# Patient Record
Sex: Male | Born: 1945 | Race: White | Hispanic: No | Smoking: Former smoker
Health system: Southern US, Community
[De-identification: ages and names within clinical notes are randomized; demographics above are authoritative.]

## PROBLEM LIST (undated history)

## (undated) DIAGNOSIS — E119 Type 2 diabetes mellitus without complications: Secondary | ICD-10-CM

## (undated) DIAGNOSIS — I219 Acute myocardial infarction, unspecified: Secondary | ICD-10-CM

## (undated) DIAGNOSIS — N183 Chronic kidney disease, stage 3 unspecified: Secondary | ICD-10-CM

## (undated) DIAGNOSIS — G4730 Sleep apnea, unspecified: Secondary | ICD-10-CM

## (undated) DIAGNOSIS — Z8679 Personal history of other diseases of the circulatory system: Secondary | ICD-10-CM

## (undated) DIAGNOSIS — Z8489 Family history of other specified conditions: Secondary | ICD-10-CM

## (undated) HISTORY — PX: CARPAL TUNNEL RELEASE: SHX101

## (undated) HISTORY — PX: NOSE SURGERY: SHX723

## (undated) HISTORY — DX: Acute myocardial infarction, unspecified: I21.9

## (undated) HISTORY — PX: REPLACEMENT TOTAL KNEE: SUR1224

## (undated) HISTORY — DX: Chronic kidney disease, stage 3 unspecified: N18.30

## (undated) HISTORY — DX: Chronic kidney disease, stage 3 (moderate): N18.3

## (undated) HISTORY — PX: CARDIAC CATHETERIZATION: SHX172

## (undated) HISTORY — DX: Type 2 diabetes mellitus without complications: E11.9

## (undated) HISTORY — PX: ABLATION SAPHENOUS VEIN W/ RFA: SUR11

---

## 2016-07-25 LAB — HEMOGLOBIN A1C: HEMOGLOBIN A1C: 9.59

## 2016-07-25 LAB — TSH: TSH: 10.4 — AB (ref <=5.90)

## 2016-09-05 ENCOUNTER — Ambulatory Visit: Payer: Self-pay | Admitting: "Endocrinology

## 2017-08-08 LAB — HM DIABETES EYE EXAM

## 2017-08-15 ENCOUNTER — Encounter: Payer: Self-pay | Admitting: "Endocrinology

## 2017-08-15 ENCOUNTER — Ambulatory Visit (INDEPENDENT_AMBULATORY_CARE_PROVIDER_SITE_OTHER): Payer: Medicare Other | Admitting: "Endocrinology

## 2017-08-15 VITALS — BP 135/71 | HR 66 | Ht 68.5 in | Wt 283.0 lb

## 2017-08-15 DIAGNOSIS — E1122 Type 2 diabetes mellitus with diabetic chronic kidney disease: Secondary | ICD-10-CM | POA: Diagnosis not present

## 2017-08-15 DIAGNOSIS — I10 Essential (primary) hypertension: Secondary | ICD-10-CM | POA: Diagnosis not present

## 2017-08-15 DIAGNOSIS — E039 Hypothyroidism, unspecified: Secondary | ICD-10-CM | POA: Diagnosis not present

## 2017-08-15 DIAGNOSIS — E1159 Type 2 diabetes mellitus with other circulatory complications: Secondary | ICD-10-CM | POA: Diagnosis not present

## 2017-08-15 DIAGNOSIS — Z794 Long term (current) use of insulin: Secondary | ICD-10-CM

## 2017-08-15 DIAGNOSIS — E782 Mixed hyperlipidemia: Secondary | ICD-10-CM | POA: Insufficient documentation

## 2017-08-15 NOTE — Patient Instructions (Signed)

## 2017-08-15 NOTE — Progress Notes (Signed)
Subjective:    Patient ID: Bryce Mejia, male    DOB: 1946-04-13.  he is being seen in consultation for management of currently uncontrolled symptomatic diabetes requested by  Alanda Amass, MD.   Past Medical History:  Diagnosis Date  . Chronic kidney disease, stage 3 ([place])   . Diabetes mellitus, type II ([place])   . Heart attack ([place])    No past surgical history on file. Social History   Social History  . Marital status: Married    Spouse name: N/A  . Number of children: N/A  . Years of education: N/A   Social History Main Topics  . Smoking status: Former Smoker    Quit date: 08/16/2007  . Smokeless tobacco: Never Used  . Alcohol use Yes     Comment: Occassional  . Drug use: No  . Sexual activity: Not Asked   Other Topics Concern  . None   Social History Narrative  . None   Outpatient Encounter Prescriptions as of 08/15/2017  Medication Sig  . apixaban (ELIQUIS) 5 MG TABS tablet Take 5 mg by mouth 2 (two) times daily.  Marland Kitchen aspirin EC 81 MG tablet Take 81 mg by mouth daily.  . cetirizine (ZYRTEC) 10 MG tablet Take 10 mg by mouth daily.  . Cholecalciferol (VITAMIN D) 2000 units CAPS Take by mouth daily.  Marland Kitchen docusate sodium (DOCQLACE) 100 MG capsule Take 100 mg by mouth 2 (two) times daily.  . finasteride (PROSCAR) 5 MG tablet Take 5 mg by mouth daily.  . Flaxseed, Linseed, (FLAXSEED OIL PO) Take by mouth daily.  . fluticasone (FLOVENT DISKUS) 50 MCG/BLIST diskus inhaler Inhale 1 puff into the lungs 2 (two) times daily.  . furosemide (LASIX) 20 MG tablet Take 20 mg by mouth every other day.  . Garlic 1694 MG CAPS Take by mouth.  Marland Kitchen glipiZIDE (GLUCOTROL) 10 MG tablet Take 10 mg by mouth 2 (two) times daily before a meal.  . HYDROcodone-acetaminophen (NORCO/VICODIN) 5-325 MG tablet Take 1 tablet by mouth every 6 (six) hours as needed for moderate pain.  Marland Kitchen insulin aspart (NOVOLOG FLEXPEN) 100 UNIT/ML FlexPen Inject 6-12 Units into the skin 3 (three) times daily before  meals.  . insulin NPH Human (HUMULIN N,NOVOLIN N) 100 UNIT/ML injection Inject 20 Units into the skin 2 (two) times daily before a meal.  . levothyroxine (SYNTHROID, LEVOTHROID) 200 MCG tablet Take 200 mcg by mouth daily before breakfast.  . lisinopril (PRINIVIL,ZESTRIL) 10 MG tablet Take 10 mg by mouth daily.  . Misc Natural Products (BLACK CHERRY CONCENTRATE PO) Take by mouth every morning.  . Multiple Vitamins-Minerals (OCUVITE ADULT 50+ PO) Take by mouth daily.  . naproxen sodium (ANAPROX) 220 MG tablet Take 220 mg by mouth 2 (two) times daily with a meal.  . pravastatin (PRAVACHOL) 20 MG tablet Take 20 mg by mouth daily.  Marland Kitchen terazosin (HYTRIN) 10 MG capsule Take 10 mg by mouth at bedtime.  . traZODone (DESYREL) 100 MG tablet Take 100 mg by mouth at bedtime.   No facility-administered encounter medications on file as of 08/15/2017.     ALLERGIES: No Known Allergies  VACCINATION STATUS:  There is no immunization history on file for this patient.  Diabetes  He presents for his initial diabetic visit. He has type 2 diabetes mellitus. Onset time: He was diagnosed at approximate age of 63 years. His disease course has been improving. There are no hypoglycemic associated symptoms. Pertinent negatives for hypoglycemia include no confusion, headaches, pallor or  seizures. Associated symptoms include polydipsia and polyuria. Pertinent negatives for diabetes include no chest pain, no fatigue, no polyphagia and no weakness. There are no hypoglycemic complications. Symptoms are improving. Diabetic complications include heart disease and nephropathy. Risk factors for coronary artery disease include dyslipidemia, diabetes mellitus, hypertension, male sex, obesity, sedentary lifestyle and tobacco exposure. Current diabetic treatment includes insulin injections and oral agent (monotherapy) (He is currently on Novolin N 20 units twice a day, NovoLog 3-12 units 3 times a day before meals, glipizide 10 mg by  mouth twice a day.). His weight is increasing steadily. He is following a generally unhealthy diet. When asked about meal planning, he reported none. He has not had a previous visit with a dietitian. He never participates in exercise. His overall blood glucose range is 140-180 mg/dl. An ACE inhibitor/angiotensin II receptor blocker is being taken.  Hyperlipidemia  This is a chronic problem. The current episode started more than 1 year ago. Exacerbating diseases include diabetes, hypothyroidism and obesity. Pertinent negatives include no chest pain, myalgias or shortness of breath. Risk factors for coronary artery disease include diabetes mellitus, dyslipidemia, hypertension, male sex, obesity and a sedentary lifestyle.  Hypertension  This is a chronic problem. The current episode started more than 1 year ago. Pertinent negatives include no chest pain, headaches, neck pain, palpitations or shortness of breath. Risk factors for coronary artery disease include dyslipidemia, diabetes mellitus, male gender, obesity, sedentary lifestyle and smoking/tobacco exposure. Hypertensive end-organ damage includes kidney disease, CAD/MI and heart failure.       Review of Systems  Constitutional: Negative for chills, fatigue, fever and unexpected weight change.  HENT: Negative for dental problem, mouth sores and trouble swallowing.   Eyes: Negative for visual disturbance.  Respiratory: Negative for cough, choking, chest tightness, shortness of breath and wheezing.   Cardiovascular: Negative for chest pain, palpitations and leg swelling.  Gastrointestinal: Negative for abdominal distention, abdominal pain, constipation, diarrhea, nausea and vomiting.  Endocrine: Positive for polydipsia and polyuria. Negative for polyphagia.  Genitourinary: Negative for dysuria, flank pain, hematuria and urgency.  Musculoskeletal: Negative for back pain, gait problem, myalgias and neck pain.  Skin: Negative for pallor, rash and  wound.  Neurological: Negative for seizures, syncope, weakness, numbness and headaches.  Psychiatric/Behavioral: Negative.  Negative for confusion and dysphoric mood.    Objective:    BP 135/71   Pulse 66   Ht 5' 8.5" (1.74 m)   Wt 283 lb (128.4 kg)   BMI 42.40 kg/m   Wt Readings from Last 3 Encounters:  08/15/17 283 lb (128.4 kg)     Physical Exam  Constitutional: He is oriented to person, place, and time. He appears well-developed and well-nourished. He is cooperative. No distress.  HENT:  Head: Normocephalic and atraumatic.  Eyes: EOM are normal.  Neck: Normal range of motion. Neck supple. No tracheal deviation present. No thyromegaly present.  Cardiovascular: Normal rate, S1 normal, S2 normal and normal heart sounds.  Exam reveals no gallop.   No murmur heard. Pulses:      Dorsalis pedis pulses are 1+ on the right side, and 1+ on the left side.       Posterior tibial pulses are 1+ on the right side, and 1+ on the left side.  Pulmonary/Chest: Breath sounds normal. No respiratory distress. He has no wheezes.  Abdominal: Soft. Bowel sounds are normal. He exhibits no distension. There is no tenderness. There is no guarding and no CVA tenderness.  Musculoskeletal: He exhibits no edema.  Right shoulder: He exhibits no swelling and no deformity.  Neurological: He is alert and oriented to person, place, and time. He has normal strength and normal reflexes. No cranial nerve deficit or sensory deficit. Gait normal.  Skin: Skin is warm and dry. No rash noted. No cyanosis. Nails show no clubbing.  Psychiatric: He has a normal mood and affect. His speech is normal. Cognition and memory are normal.   Referral package includes labs from September 2017 when his A1c was 9.59%, TSH 10.4   Assessment & Plan:   1. DM type 2 causing vascular disease ([place]) - Patient has currently uncontrolled symptomatic type 2 DM since  71 years of age,  with most recent A1c of 9.5 % ( [facility] verbally  reports that most recent A1c was 7+%- not available for review today.  -He'll be sent for new set of labs including thyroid function tests and A1c today.   -his diabetes is complicated by coronary artery disease, CK D, obesity/sedentary life and Bryce Mejia remains at a high risk for more acute and chronic complications which include CAD, CVA, CKD, retinopathy, and neuropathy. These are all discussed in detail with the patient.  - I have counseled him on diet management and weight loss, by adopting a carbohydrate restricted/protein rich diet.  - Suggestion is made for him to avoid simple carbohydrates  from his diet including Cakes, Sweet Desserts, Ice Cream, Soda (diet and regular), Sweet Tea, Candies, Chips, Cookies, Store Bought Juices, Alcohol in Excess of  1-2 drinks a day, Artificial Sweeteners, and "Sugar-free" Products. This will help patient to have stable blood glucose profile and potentially avoid unintended weight gain.  - I encouraged him to switch to  unprocessed or minimally processed complex starch and increased protein intake (animal or plant source), fruits, and vegetables.  - he is advised to stick to a routine mealtimes to eat 3 meals  a day and avoid unnecessary snacks ( to snack only to correct hypoglycemia).   - he will be scheduled with Jearld Fenton, RDN, CDE for individualized DM education.  - I have approached him with the following individualized plan to manage diabetes and patient agrees:   - He is long acting insulin is not the best choice, however, he wishes to stay on it for cost reasons. - I  will proceed with basal insulin Novolin N 20 units with breakfast and 20 units at bedtime, readjust his NovoLog at 6 units 3 times a day with meals  for pre-meal BG readings of 90-150mg /dl, plus patient specific correction dose for unexpected hyperglycemia above 150mg /dl, associated with strict monitoring of glucose 4 times a day-before meals and at bedtime. - Patient is  warned not to take insulin without proper monitoring per orders. -Adjustment parameters are given for hypo and hyperglycemia in writing. -Patient is encouraged to call clinic for blood glucose levels less than 70 or above 300 mg /dl.  - I will discontinue glipizide, risk outweighs benefit for this patient. -Patient is not a candidate for  SGLT2 inhibitors due to CKD. - he will be considered for incretin therapy as appropriate next visit.  - Patient specific target  A1c;  LDL, HDL, Triglycerides, and  Waist Circumference were discussed in detail.  2) BP/HTN: Controlled. Continue current medications including ACEI/ARB. 3) Lipids/HPL:   Control unknown.   Patient is advised to continue statins. 4)  Weight/Diet: CDE Consult will be initiated , exercise, and detailed carbohydrates information provided.  5) Chronic Care/Health Maintenance:  -he  is on ACEI/ARB and Statin medications and  is encouraged to continue to follow up with Ophthalmology, Dentist,  Podiatrist at least yearly or according to recommendations, and advised to   stay away from smoking. I have recommended yearly flu vaccine and pneumonia vaccination at least every 5 years; moderate intensity exercise for up to 150 minutes weekly; and  sleep for at least 7 hours a day.  - Time spent with the patient: 1 hour, of which >50% was spent in obtaining information about his symptoms, reviewing his previous labs, evaluations, and treatments, counseling him about his  currently uncontrolled type 2 diabetes which is complicated by coronary artery disease and CK D, hypothyroidism, hyperlipidemia, and hypertension, and developing a plan for long term treatment.  - Patient to bring meter and  blood glucose logs during his next visit.  - I advised patient to maintain close follow up with Alanda Amass, MD for primary care needs.  Follow up plan: - Return in about 1 week (around 08/22/2017) for meter, and logs, labs today.  Glade Lloyd,  MD Phone: [phone]  Fax: [phone]   08/15/2017, 5:00 PM This note was partially dictated with voice recognition software. Similar sounding words can be transcribed inadequately or may not  be corrected upon review.

## 2017-08-16 LAB — COMPLETE METABOLIC PANEL WITH GFR
AG RATIO: 1.7 (calc) (ref 1.0–2.5)
ALT: 24 U/L (ref 9–46)
AST: 23 U/L (ref 10–35)
Albumin: 4.2 g/dL (ref 3.6–5.1)
Alkaline phosphatase (APISO): 61 U/L (ref 40–115)
BUN/Creatinine Ratio: 19 (calc) (ref 6–22)
BUN: 32 mg/dL — ABNORMAL HIGH (ref 7–25)
CALCIUM: 9.8 mg/dL (ref 8.6–10.3)
CO2: 26 mmol/L (ref 20–32)
Chloride: 108 mmol/L (ref 98–110)
Creat: 1.72 mg/dL — ABNORMAL HIGH (ref 0.70–1.18)
GFR, EST AFRICAN AMERICAN: 45 mL/min/{1.73_m2} — AB (ref >=60)
GFR, EST NON AFRICAN AMERICAN: 39 mL/min/{1.73_m2} — AB (ref >=60)
GLOBULIN: 2.5 g/dL (ref 1.9–3.7)
Glucose, Bld: 76 mg/dL (ref 65–139)
POTASSIUM: 5 mmol/L (ref 3.5–5.3)
SODIUM: 141 mmol/L (ref 135–146)
TOTAL PROTEIN: 6.7 g/dL (ref 6.1–8.1)
Total Bilirubin: 0.5 mg/dL (ref 0.2–1.2)

## 2017-08-16 LAB — HEMOGLOBIN A1C
Hgb A1c MFr Bld: 6.4 % of total Hgb — ABNORMAL HIGH (ref <5.7)
Mean Plasma Glucose: 137 (calc)
eAG (mmol/L): 7.6 (calc)

## 2017-08-16 LAB — TSH: TSH: 0.17 mIU/L — ABNORMAL LOW (ref 0.40–4.50)

## 2017-08-16 LAB — T4, FREE: FREE T4: 1.3 ng/dL (ref 0.8–1.8)

## 2017-08-21 ENCOUNTER — Ambulatory Visit (INDEPENDENT_AMBULATORY_CARE_PROVIDER_SITE_OTHER): Payer: Medicare Other | Admitting: "Endocrinology

## 2017-08-21 ENCOUNTER — Encounter: Payer: Self-pay | Admitting: "Endocrinology

## 2017-08-21 VITALS — BP 138/68 | HR 64 | Ht 68.5 in | Wt 271.0 lb

## 2017-08-21 DIAGNOSIS — E039 Hypothyroidism, unspecified: Secondary | ICD-10-CM

## 2017-08-21 DIAGNOSIS — E1159 Type 2 diabetes mellitus with other circulatory complications: Secondary | ICD-10-CM

## 2017-08-21 DIAGNOSIS — N183 Chronic kidney disease, stage 3 unspecified: Secondary | ICD-10-CM

## 2017-08-21 DIAGNOSIS — Z794 Long term (current) use of insulin: Secondary | ICD-10-CM

## 2017-08-21 DIAGNOSIS — E1122 Type 2 diabetes mellitus with diabetic chronic kidney disease: Secondary | ICD-10-CM | POA: Diagnosis not present

## 2017-08-21 DIAGNOSIS — I10 Essential (primary) hypertension: Secondary | ICD-10-CM | POA: Diagnosis not present

## 2017-08-21 MED ORDER — INSULIN NPH ISOPHANE & REGULAR (70-30) 100 UNIT/ML ~~LOC~~ SUSP: 15.0000 [IU] | Freq: Two times a day (BID) | SUBCUTANEOUS | 2 refills | Status: DC

## 2017-08-21 NOTE — Progress Notes (Signed)
Subjective:    Patient ID: Bryce Mejia, male    DOB: 1945-11-25.  he is being seen in consultation for management of currently uncontrolled symptomatic diabetes requested by  Alanda Amass, MD.   Past Medical History:  Diagnosis Date  . Chronic kidney disease, stage 3 ([place])   . Diabetes mellitus, type II ([place])   . Heart attack ([place])    No past surgical history on file. Social History   Social History  . Marital status: Married    Spouse name: N/A  . Number of children: N/A  . Years of education: N/A   Social History Main Topics  . Smoking status: Former Smoker    Quit date: 08/16/2007  . Smokeless tobacco: Never Used  . Alcohol use Yes     Comment: Occassional  . Drug use: No  . Sexual activity: Not on file   Other Topics Concern  . Not on file   Social History Narrative  . No narrative on file   Outpatient Encounter Prescriptions as of 08/21/2017  Medication Sig  . apixaban (ELIQUIS) 5 MG TABS tablet Take 5 mg by mouth 2 (two) times daily.  Marland Kitchen aspirin EC 81 MG tablet Take 81 mg by mouth daily.  . cetirizine (ZYRTEC) 10 MG tablet Take 10 mg by mouth daily.  . Cholecalciferol (VITAMIN D) 2000 units CAPS Take by mouth daily.  Marland Kitchen docusate sodium (DOCQLACE) 100 MG capsule Take 100 mg by mouth 2 (two) times daily.  . finasteride (PROSCAR) 5 MG tablet Take 5 mg by mouth daily.  . Flaxseed, Linseed, (FLAXSEED OIL PO) Take by mouth daily.  . fluticasone (FLOVENT DISKUS) 50 MCG/BLIST diskus inhaler Inhale 1 puff into the lungs 2 (two) times daily.  . furosemide (LASIX) 20 MG tablet Take 20 mg by mouth every other day.  . Garlic 2979 MG CAPS Take by mouth.  Marland Kitchen HYDROcodone-acetaminophen (NORCO/VICODIN) 5-325 MG tablet Take 1 tablet by mouth every 6 (six) hours as needed for moderate pain.  Marland Kitchen insulin NPH-regular Human (NOVOLIN 70/30) (70-30) 100 UNIT/ML injection Inject 15 Units into the skin 2 (two) times daily with a meal.  . levothyroxine (SYNTHROID, LEVOTHROID) 200  MCG tablet Take 200 mcg by mouth daily before breakfast.  . lisinopril (PRINIVIL,ZESTRIL) 10 MG tablet Take 10 mg by mouth daily.  . Misc Natural Products (BLACK CHERRY CONCENTRATE PO) Take by mouth every morning.  . Multiple Vitamins-Minerals (OCUVITE ADULT 50+ PO) Take by mouth daily.  . naproxen sodium (ANAPROX) 220 MG tablet Take 220 mg by mouth 2 (two) times daily with a meal.  . pravastatin (PRAVACHOL) 20 MG tablet Take 20 mg by mouth daily.  Marland Kitchen terazosin (HYTRIN) 10 MG capsule Take 10 mg by mouth at bedtime.  . traZODone (DESYREL) 100 MG tablet Take 100 mg by mouth at bedtime.  . [DISCONTINUED] glipiZIDE (GLUCOTROL) 10 MG tablet Take 10 mg by mouth 2 (two) times daily before a meal.  . [DISCONTINUED] insulin aspart (NOVOLOG FLEXPEN) 100 UNIT/ML FlexPen Inject 6-12 Units into the skin 3 (three) times daily before meals.  . [DISCONTINUED] insulin NPH Human (HUMULIN N,NOVOLIN N) 100 UNIT/ML injection Inject 20 Units into the skin 2 (two) times daily before a meal.   No facility-administered encounter medications on file as of 08/21/2017.     ALLERGIES: No Known Allergies  VACCINATION STATUS:  There is no immunization history on file for this patient.  Diabetes  He presents for his follow-up diabetic visit. He has type 2 diabetes mellitus.  Onset time: He was diagnosed at approximate age of 85 years. His disease course has been improving. There are no hypoglycemic associated symptoms. Pertinent negatives for hypoglycemia include no confusion, headaches, pallor or seizures. Associated symptoms include polydipsia and polyuria. Pertinent negatives for diabetes include no chest pain, no fatigue, no polyphagia and no weakness. There are no hypoglycemic complications. Symptoms are improving. Diabetic complications include heart disease and nephropathy. Risk factors for coronary artery disease include dyslipidemia, diabetes mellitus, hypertension, male sex, obesity, sedentary lifestyle and tobacco  exposure. Current diabetic treatment includes insulin injections and oral agent (monotherapy) (He is currently on Novolin N 20 units twice a day, NovoLog 3-12 units 3 times a day before meals, glipizide 10 mg by mouth twice a day.). His weight is decreasing steadily. He is following a generally unhealthy diet. When asked about meal planning, he reported none. He has not had a previous visit with a dietitian. He never participates in exercise. His breakfast blood glucose range is generally 130-140 mg/dl. His lunch blood glucose range is generally 130-140 mg/dl. His dinner blood glucose range is generally 130-140 mg/dl. His bedtime blood glucose range is generally 130-140 mg/dl. His overall blood glucose range is 130-140 mg/dl. An ACE inhibitor/angiotensin II receptor blocker is being taken.  Hyperlipidemia  This is a chronic problem. The current episode started more than 1 year ago. Exacerbating diseases include diabetes, hypothyroidism and obesity. Pertinent negatives include no chest pain, myalgias or shortness of breath. Risk factors for coronary artery disease include diabetes mellitus, dyslipidemia, hypertension, male sex, obesity and a sedentary lifestyle.  Hypertension  This is a chronic problem. The current episode started more than 1 year ago. Pertinent negatives include no chest pain, headaches, neck pain, palpitations or shortness of breath. Risk factors for coronary artery disease include dyslipidemia, diabetes mellitus, male gender, obesity, sedentary lifestyle and smoking/tobacco exposure. Hypertensive end-organ damage includes kidney disease, CAD/MI and heart failure.    Review of Systems  Constitutional: Negative for chills, fatigue, fever and unexpected weight change.  HENT: Negative for dental problem, mouth sores and trouble swallowing.   Eyes: Negative for visual disturbance.  Respiratory: Negative for cough, choking, chest tightness, shortness of breath and wheezing.   Cardiovascular:  Negative for chest pain, palpitations and leg swelling.  Gastrointestinal: Negative for abdominal distention, abdominal pain, constipation, diarrhea, nausea and vomiting.  Endocrine: Positive for polydipsia and polyuria. Negative for polyphagia.  Genitourinary: Negative for dysuria, flank pain, hematuria and urgency.  Musculoskeletal: Negative for back pain, gait problem, myalgias and neck pain.  Skin: Negative for pallor, rash and wound.  Neurological: Negative for seizures, syncope, weakness, numbness and headaches.  Psychiatric/Behavioral: Negative.  Negative for confusion and dysphoric mood.    Objective:    BP 138/68   Pulse 64   Ht 5' 8.5" (1.74 m)   Wt 271 lb (122.9 kg)   BMI 40.61 kg/m   Wt Readings from Last 3 Encounters:  08/21/17 271 lb (122.9 kg)  08/15/17 283 lb (128.4 kg)     Physical Exam  Constitutional: He is oriented to person, place, and time. He appears well-developed and well-nourished. He is cooperative. No distress.  HENT:  Head: Normocephalic and atraumatic.  Eyes: EOM are normal.  Neck: Normal range of motion. Neck supple. No tracheal deviation present. No thyromegaly present.  Cardiovascular: Normal rate, S1 normal, S2 normal and normal heart sounds.  Exam reveals no gallop.   No murmur heard. Pulses:      Dorsalis pedis pulses are 1+ on  the right side, and 1+ on the left side.       Posterior tibial pulses are 1+ on the right side, and 1+ on the left side.  Pulmonary/Chest: Breath sounds normal. No respiratory distress. He has no wheezes.  Abdominal: Soft. Bowel sounds are normal. He exhibits no distension. There is no tenderness. There is no guarding and no CVA tenderness.  Musculoskeletal: He exhibits no edema.       Right shoulder: He exhibits no swelling and no deformity.  Neurological: He is alert and oriented to person, place, and time. He has normal strength and normal reflexes. No cranial nerve deficit or sensory deficit. Gait normal.  Skin:  Skin is warm and dry. No rash noted. No cyanosis. Nails show no clubbing.  Psychiatric: He has a normal mood and affect. His speech is normal. Cognition and memory are normal.   Referral package includes labs from September 2017 when his A1c was 9.59%, TSH 10.4 Recent Results (from the past 2160 hour(s))  HM DIABETES EYE EXAM     Status: None   Collection Time: 08/08/17  2:47 PM  Result Value Ref Range   [facility] Diabetic Eye Exam No Retinopathy No Retinopathy  COMPLETE METABOLIC PANEL WITH GFR     Status: Abnormal   Collection Time: 08/15/17  3:53 PM  Result Value Ref Range   Glucose, Bld 76 65 - 139 mg/dL    Comment: .        Non-fasting reference interval .    BUN 32 (H) 7 - 25 mg/dL   Creat 1.72 (H) 0.70 - 1.18 mg/dL    Comment: For patients >4 years of age, the reference limit for Creatinine is approximately 13% higher for people identified as African-American. .    GFR, Est Non African American 39 (L) > OR = 60 mL/min/1.42m2   GFR, Est African American 45 (L) > OR = 60 mL/min/1.29m2   BUN/Creatinine Ratio 19 6 - 22 (calc)   Sodium 141 135 - 146 mmol/L   Potassium 5.0 3.5 - 5.3 mmol/L   Chloride 108 98 - 110 mmol/L   CO2 26 20 - 32 mmol/L   Calcium 9.8 8.6 - 10.3 mg/dL   Total Protein 6.7 6.1 - 8.1 g/dL   Albumin 4.2 3.6 - 5.1 g/dL   Globulin 2.5 1.9 - 3.7 g/dL (calc)   AG Ratio 1.7 1.0 - 2.5 (calc)   Total Bilirubin 0.5 0.2 - 1.2 mg/dL   Alkaline phosphatase (APISO) 61 40 - 115 U/L   AST 23 10 - 35 U/L   ALT 24 9 - 46 U/L  Hemoglobin A1c     Status: Abnormal   Collection Time: 08/15/17  3:53 PM  Result Value Ref Range   Hgb A1c MFr Bld 6.4 (H) <5.7 % of total Hgb    Comment: For someone without known diabetes, a hemoglobin  A1c value between 5.7% and 6.4% is consistent with prediabetes and should be confirmed with a  follow-up test. . For someone with known diabetes, a value <7% indicates that their diabetes is well controlled. A1c targets should be individualized  based on duration of diabetes, age, comorbid conditions, and other considerations. . This assay result is consistent with an increased risk of diabetes. . Currently, no consensus exists regarding use of hemoglobin A1c for diagnosis of diabetes for children. .    Mean Plasma Glucose 137 (calc)   eAG (mmol/L) 7.6 (calc)  TSH     Status: Abnormal   Collection  Time: 08/15/17  3:53 PM  Result Value Ref Range   TSH 0.17 (L) 0.40 - 4.50 mIU/L  T4, free     Status: None   Collection Time: 08/15/17  3:53 PM  Result Value Ref Range   Free T4 1.3 0.8 - 1.8 ng/dL     Assessment & Plan:   1. DM type 2 causing vascular disease ([place]) - Patient has currently uncontrolled symptomatic type 2 DM since  71 years of age. - Patient came with dramatic improvement in his glycemic profile averaging 127, his A1c improving to 6.4% from 9.5%.  -his diabetes is complicated by coronary artery disease, CKD, obesity/sedentary life and Janathan Bribiesca remains at a high risk for more acute and chronic complications which include CAD, CVA, CKD, retinopathy, and neuropathy. These are all discussed in detail with the patient.  - I have counseled him on diet management and weight loss, by adopting a carbohydrate restricted/protein rich diet.  - Suggestion is made for him to avoid simple carbohydrates  from his diet including Cakes, Sweet Desserts, Ice Cream, Soda (diet and regular), Sweet Tea, Candies, Chips, Cookies, Store Bought Juices, Alcohol in Excess of  1-2 drinks a day, Artificial Sweeteners, and "Sugar-free" Products. This will help patient to have stable blood glucose profile and potentially avoid unintended weight gain.  - I encouraged him to switch to  unprocessed or minimally processed complex starch and increased protein intake (animal or plant source), fruits, and vegetables.  - he is advised to stick to a routine mealtimes to eat 3 meals  a day and avoid unnecessary snacks ( to snack only to correct  hypoglycemia).   - he will be scheduled with Jearld Fenton, RDN, CDE for individualized DM education- consult pending  - I have approached him with the following individualized plan to manage diabetes and patient agrees:   - Based on his commitment and clinical improvement, his treatment can be simplified. - I will change his insulin to NovoLog in 70/30, 15 units with breakfast and 15 units with supper for pre-meal blood glucose readings above 90 mg/dL, associated with strict monitoring of blood glucose 2 times a day-before breakfast and before supper.  -I advised him to discontinue his NPH and NovoLog. - Patient is warned not to take insulin without proper monitoring per orders.  -Patient is encouraged to call clinic for blood glucose levels less than 70 or above 300 mg /dl.  -Patient is not a candidate for  SGLT2 inhibitors due to CKD. - he will be considered for metformin if renal function improves, and/or  incretin therapy as appropriate next visit.  - Patient specific target  A1c;  LDL, HDL, Triglycerides, and  Waist Circumference were discussed in detail.  2) BP/HTN: Controlled. Continue current medications including ACEI/ARB. 3) Lipids/HPL:   Control unknown. He would have fasting lipid panel on subsequent visits.  Patient is advised to continue statins. 4)  Weight/Diet: CDE Consult will be initiated , exercise, and detailed carbohydrates information provided.  5) hypothyroidism: - His thyroid function tests are consistent with appropriate replacement. -I advised him to continue levothyroxine on 200 g by mouth every morning for now.   - We discussed about correct intake of levothyroxine, at fasting, with water, separated by at least 30 minutes from breakfast, and separated by more than 4 hours from calcium, iron, multivitamins, acid reflux medications (PPIs). -Patient is made aware of the fact that thyroid hormone replacement is needed for life, dose to be adjusted by periodic  monitoring of thyroid function tests.  5) Chronic Care/Health Maintenance:  -he  is on ACEI/ARB and Statin medications and  is encouraged to continue to follow up with Ophthalmology, Dentist,  Podiatrist at least yearly or according to recommendations, and advised to   stay away from smoking. I have recommended yearly flu vaccine and pneumonia vaccination at least every 5 years; moderate intensity exercise for up to 150 minutes weekly; and  sleep for at least 7 hours a day.  - Time spent with the patient: 25 min, of which >50% was spent in reviewing his sugar logs , discussing his hypo- and hyper-glycemic episodes, reviewing his current and  previous labs and insulin doses and developing a plan to avoid hypo- and hyper-glycemia.    - Patient to bring meter and  blood glucose logs during his next visit.  - I advised patient to maintain close follow up with Alanda Amass, MD for primary care needs.  Follow up plan: - Return in about 3 months (around 11/21/2017) for follow up with pre-visit labs, meter, and logs.  Glade Lloyd, MD Phone: [phone]  Fax: [phone]   08/21/2017, 2:27 PM This note was partially dictated with voice recognition software. Similar sounding words can be transcribed inadequately or may not  be corrected upon review.

## 2017-08-21 NOTE — Patient Instructions (Signed)

## 2017-08-22 ENCOUNTER — Other Ambulatory Visit: Payer: Self-pay

## 2017-08-22 MED ORDER — INSULIN NPH ISOPHANE & REGULAR (70-30) 100 UNIT/ML ~~LOC~~ SUSP: 15.0000 [IU] | Freq: Two times a day (BID) | SUBCUTANEOUS | 2 refills | Status: DC

## 2018-01-01 ENCOUNTER — Ambulatory Visit: Payer: Medicare Other | Admitting: "Endocrinology

## 2018-01-02 LAB — COMPLETE METABOLIC PANEL WITH GFR
AG RATIO: 1.6 (calc) (ref 1.0–2.5)
ALBUMIN MSPROF: 3.8 g/dL (ref 3.6–5.1)
ALKALINE PHOSPHATASE (APISO): 57 U/L (ref 40–115)
ALT: 17 U/L (ref 9–46)
AST: 21 U/L (ref 10–35)
BILIRUBIN TOTAL: 0.4 mg/dL (ref 0.2–1.2)
BUN / CREAT RATIO: 15 (calc) (ref 6–22)
BUN: 29 mg/dL — ABNORMAL HIGH (ref 7–25)
CHLORIDE: 106 mmol/L (ref 98–110)
CO2: 27 mmol/L (ref 20–32)
Calcium: 9 mg/dL (ref 8.6–10.3)
Creat: 1.91 mg/dL — ABNORMAL HIGH (ref 0.70–1.18)
GFR, EST AFRICAN AMERICAN: 40 mL/min/{1.73_m2} — AB (ref >=60)
GFR, Est Non African American: 34 mL/min/{1.73_m2} — ABNORMAL LOW (ref >=60)
Globulin: 2.4 g/dL (calc) (ref 1.9–3.7)
Glucose, Bld: 158 mg/dL — ABNORMAL HIGH (ref 65–139)
POTASSIUM: 4.7 mmol/L (ref 3.5–5.3)
Sodium: 140 mmol/L (ref 135–146)
Total Protein: 6.2 g/dL (ref 6.1–8.1)

## 2018-01-02 LAB — HEMOGLOBIN A1C
EAG (MMOL/L): 8.4 (calc)
HEMOGLOBIN A1C: 6.9 %{Hb} — AB (ref <5.7)
MEAN PLASMA GLUCOSE: 151 (calc)

## 2018-01-17 ENCOUNTER — Encounter: Payer: Self-pay | Admitting: "Endocrinology

## 2018-01-17 ENCOUNTER — Ambulatory Visit (INDEPENDENT_AMBULATORY_CARE_PROVIDER_SITE_OTHER): Payer: Medicare Other | Admitting: "Endocrinology

## 2018-01-17 VITALS — BP 138/69 | HR 65 | Ht 68.5 in | Wt 267.0 lb

## 2018-01-17 DIAGNOSIS — N183 Chronic kidney disease, stage 3 unspecified: Secondary | ICD-10-CM

## 2018-01-17 DIAGNOSIS — I10 Essential (primary) hypertension: Secondary | ICD-10-CM | POA: Diagnosis not present

## 2018-01-17 DIAGNOSIS — E1159 Type 2 diabetes mellitus with other circulatory complications: Secondary | ICD-10-CM

## 2018-01-17 DIAGNOSIS — E782 Mixed hyperlipidemia: Secondary | ICD-10-CM

## 2018-01-17 DIAGNOSIS — E039 Hypothyroidism, unspecified: Secondary | ICD-10-CM

## 2018-01-17 DIAGNOSIS — E1122 Type 2 diabetes mellitus with diabetic chronic kidney disease: Secondary | ICD-10-CM | POA: Insufficient documentation

## 2018-01-17 DIAGNOSIS — Z794 Long term (current) use of insulin: Secondary | ICD-10-CM

## 2018-01-17 NOTE — Patient Instructions (Signed)

## 2018-01-17 NOTE — Progress Notes (Signed)
Subjective:    Patient ID: Bryce Mejia, male    DOB: [birth year].  he is being seen in follow-up for management of currently uncontrolled symptomatic diabetes requested by  Alanda Amass, MD.   Past Medical History:  Diagnosis Date  . Chronic kidney disease, stage 3 ([place])   . Diabetes mellitus, type II ([place])   . Heart attack [facility])    History reviewed. No pertinent surgical history. Social History   Socioeconomic History  . Marital status: Married    Spouse name: None  . Number of children: None  . Years of education: None  . Highest education level: None  Social Needs  . Financial resource strain: None  . Food insecurity - worry: None  . Food insecurity - inability: None  . Transportation needs - medical: None  . Transportation needs - non-medical: None  Occupational History  . None  Tobacco Use  . Smoking status: Former Smoker    Last attempt to quit: 08/16/2007    Years since quitting: 10.4  . Smokeless tobacco: Never Used  Substance and Sexual Activity  . Alcohol use: Yes    Comment: Occassional  . Drug use: No  . Sexual activity: None  Other Topics Concern  . None  Social History Narrative  . None   Outpatient Encounter Medications as of 01/17/2018  Medication Sig  . apixaban (ELIQUIS) 5 MG TABS tablet Take 5 mg by mouth 2 (two) times daily.  Marland Kitchen aspirin EC 81 MG tablet Take 81 mg by mouth daily.  . cetirizine (ZYRTEC) 10 MG tablet Take 10 mg by mouth daily.  . Cholecalciferol (VITAMIN D) 2000 units CAPS Take by mouth daily.  Marland Kitchen docusate sodium (DOCQLACE) 100 MG capsule Take 100 mg by mouth 2 (two) times daily.  . finasteride (PROSCAR) 5 MG tablet Take 5 mg by mouth daily.  . Flaxseed, Linseed, (FLAXSEED OIL PO) Take by mouth daily.  . fluticasone (FLOVENT DISKUS) 50 MCG/BLIST diskus inhaler Inhale 1 puff into the lungs 2 (two) times daily.  . furosemide (LASIX) 20 MG tablet Take 20 mg by mouth every other day.  . Garlic 7616 MG CAPS Take by mouth.  Marland Kitchen  HYDROcodone-acetaminophen (NORCO/VICODIN) 5-325 MG tablet Take 1 tablet by mouth every 6 (six) hours as needed for moderate pain.  Marland Kitchen insulin NPH-regular Human (NOVOLIN 70/30) (70-30) 100 UNIT/ML injection Inject 15 Units into the skin 2 (two) times daily with a meal.  . levothyroxine (SYNTHROID, LEVOTHROID) 200 MCG tablet Take 200 mcg by mouth daily before breakfast.  . lisinopril (PRINIVIL,ZESTRIL) 10 MG tablet Take 10 mg by mouth daily.  . Misc Natural Products (BLACK CHERRY CONCENTRATE PO) Take by mouth every morning.  . Multiple Vitamins-Minerals (OCUVITE ADULT 50+ PO) Take by mouth daily.  . naproxen sodium (ANAPROX) 220 MG tablet Take 220 mg by mouth 2 (two) times daily with a meal.  . pravastatin (PRAVACHOL) 20 MG tablet Take 20 mg by mouth daily.  Marland Kitchen terazosin (HYTRIN) 10 MG capsule Take 10 mg by mouth at bedtime.  . traZODone (DESYREL) 100 MG tablet Take 100 mg by mouth at bedtime.   No facility-administered encounter medications on file as of 01/17/2018.     ALLERGIES: No Known Allergies  VACCINATION STATUS:  There is no immunization history on file for this patient.  Diabetes  He presents for his follow-up diabetic visit. He has type 2 diabetes mellitus. Onset time: He was diagnosed at approximate age of 5 years. His disease course has been stable.  There are no hypoglycemic associated symptoms. Pertinent negatives for hypoglycemia include no confusion, headaches, pallor or seizures. Pertinent negatives for diabetes include no chest pain, no fatigue, no polydipsia, no polyphagia, no polyuria and no weakness. There are no hypoglycemic complications. Symptoms are stable. Diabetic complications include heart disease and nephropathy. Risk factors for coronary artery disease include dyslipidemia, diabetes mellitus, hypertension, male sex, obesity, sedentary lifestyle and tobacco exposure. Current diabetic treatment includes insulin injections and oral agent (monotherapy) (He is currently on  Novolin N 20 units twice a day, NovoLog 3-12 units 3 times a day before meals, glipizide 10 mg by mouth twice a day.). His weight is decreasing steadily. He is following a generally unhealthy diet. When asked about meal planning, he reported none. He has not had a previous visit with a dietitian. He never participates in exercise. His breakfast blood glucose range is generally 130-140 mg/dl. His bedtime blood glucose range is generally 130-140 mg/dl. His overall blood glucose range is 130-140 mg/dl. An ACE inhibitor/angiotensin II receptor blocker is being taken.  Hyperlipidemia  This is a chronic problem. The current episode started more than 1 year ago. The problem is uncontrolled. Exacerbating diseases include diabetes, hypothyroidism and obesity. Pertinent negatives include no chest pain, myalgias or shortness of breath. Current antihyperlipidemic treatment includes statins. Risk factors for coronary artery disease include diabetes mellitus, dyslipidemia, hypertension, male sex, obesity and a sedentary lifestyle.  Hypertension  This is a chronic problem. The current episode started more than 1 year ago. Pertinent negatives include no chest pain, headaches, neck pain, palpitations or shortness of breath. Risk factors for coronary artery disease include dyslipidemia, diabetes mellitus, male gender, obesity, sedentary lifestyle and smoking/tobacco exposure. Hypertensive end-organ damage includes kidney disease, CAD/MI and heart failure.    Review of Systems  Constitutional: Negative for chills, fatigue, fever and unexpected weight change.  HENT: Negative for dental problem, mouth sores and trouble swallowing.   Eyes: Negative for visual disturbance.  Respiratory: Negative for cough, choking, chest tightness, shortness of breath and wheezing.   Cardiovascular: Negative for chest pain, palpitations and leg swelling.  Gastrointestinal: Negative for abdominal distention, abdominal pain, constipation,  diarrhea, nausea and vomiting.  Endocrine: Negative for polydipsia, polyphagia and polyuria.  Genitourinary: Negative for dysuria, flank pain, hematuria and urgency.  Musculoskeletal: Negative for back pain, gait problem, myalgias and neck pain.  Skin: Negative for pallor, rash and wound.  Neurological: Negative for seizures, syncope, weakness, numbness and headaches.  Psychiatric/Behavioral: Negative.  Negative for confusion and dysphoric mood.    Objective:    BP 138/69   Pulse 65   Ht 5' 8.5" (1.74 m)   Wt 267 lb (121.1 kg)   BMI 40.01 kg/m   Wt Readings from Last 3 Encounters:  01/17/18 267 lb (121.1 kg)  08/21/17 271 lb (122.9 kg)  08/15/17 283 lb (128.4 kg)     Physical Exam  Constitutional: He is oriented to person, place, and time. He appears well-developed and well-nourished. He is cooperative. No distress.  HENT:  Head: Normocephalic and atraumatic.  Eyes: EOM are normal.  Neck: Normal range of motion. Neck supple. No tracheal deviation present. No thyromegaly present.  Cardiovascular: Normal rate, S1 normal, S2 normal and normal heart sounds. Exam reveals no gallop.  No murmur heard. Pulses:      Dorsalis pedis pulses are 1+ on the right side, and 1+ on the left side.       Posterior tibial pulses are 1+ on the right side, and 1+  on the left side.  Pulmonary/Chest: Breath sounds normal. No respiratory distress. He has no wheezes.  Abdominal: Soft. Bowel sounds are normal. He exhibits no distension. There is no tenderness. There is no guarding and no CVA tenderness.  Musculoskeletal: He exhibits no edema.       Right shoulder: He exhibits no swelling and no deformity.  Neurological: He is alert and oriented to person, place, and time. He has normal strength and normal reflexes. No cranial nerve deficit or sensory deficit. Gait normal.  Skin: Skin is warm and dry. No rash noted. No cyanosis. Nails show no clubbing.  Psychiatric: He has a normal mood and affect. His  speech is normal. Cognition and memory are normal.    Recent Results (from the past 2160 hour(s))  COMPLETE METABOLIC PANEL WITH GFR     Status: Abnormal   Collection Time: 01/01/18  1:41 PM  Result Value Ref Range   Glucose, Bld 158 (H) 65 - 139 mg/dL    Comment: .        Non-fasting reference interval .    BUN 29 (H) 7 - 25 mg/dL   Creat 1.91 (H) 0.70 - 1.18 mg/dL    Comment: For patients >2 years of age, the reference limit for Creatinine is approximately 13% higher for people identified as African-American. .    GFR, Est Non African American 34 (L) > OR = 60 mL/min/1.33m2   GFR, Est African American 40 (L) > OR = 60 mL/min/1.38m2   BUN/Creatinine Ratio 15 6 - 22 (calc)   Sodium 140 135 - 146 mmol/L   Potassium 4.7 3.5 - 5.3 mmol/L   Chloride 106 98 - 110 mmol/L   CO2 27 20 - 32 mmol/L   Calcium 9.0 8.6 - 10.3 mg/dL   Total Protein 6.2 6.1 - 8.1 g/dL   Albumin 3.8 3.6 - 5.1 g/dL   Globulin 2.4 1.9 - 3.7 g/dL (calc)   AG Ratio 1.6 1.0 - 2.5 (calc)   Total Bilirubin 0.4 0.2 - 1.2 mg/dL   Alkaline phosphatase (APISO) 57 40 - 115 U/L   AST 21 10 - 35 U/L   ALT 17 9 - 46 U/L  Hemoglobin A1c     Status: Abnormal   Collection Time: 01/01/18  1:41 PM  Result Value Ref Range   Hgb A1c MFr Bld 6.9 (H) <5.7 % of total Hgb    Comment: For someone without known diabetes, a hemoglobin A1c value of 6.5% or greater indicates that they may have  diabetes and this should be confirmed with a follow-up  test. . For someone with known diabetes, a value <7% indicates  that their diabetes is well controlled and a value  greater than or equal to 7% indicates suboptimal  control. A1c targets should be individualized based on  duration of diabetes, age, comorbid conditions, and  other considerations. . Currently, no consensus exists regarding use of hemoglobin A1c for diagnosis of diabetes for children. .    Mean Plasma Glucose 151 (calc)   eAG (mmol/L) 8.4 (calc)     Assessment  & Plan:   1. DM type 2 causing vascular disease ([place]) - Patient has currently uncontrolled symptomatic type 2 DM since  72 years of age. - Patient came with continued improvement in his blood glucose profile, A1c at 6.9% stable from his last visit.  He has improved his A1c from 9.5% overall.   -his diabetes is complicated by coronary artery disease, CKD, obesity/sedentary life and Jceon  Thrun remains at a high risk for more acute and chronic complications which include CAD, CVA, CKD, retinopathy, and neuropathy. These are all discussed in detail with the patient.  - I have counseled him on diet management and weight loss, by adopting a carbohydrate restricted/protein rich diet.  -  Suggestion is made for him to avoid simple carbohydrates  from his diet including Cakes, Sweet Desserts / Pastries, Ice Cream, Soda (diet and regular), Sweet Tea, Candies, Chips, Cookies, Store Bought Juices, Alcohol in Excess of  1-2 drinks a day, Artificial Sweeteners, and "Sugar-free" Products. This will help patient to have stable blood glucose profile and potentially avoid unintended weight gain.  weight gain.  - I encouraged him to switch to  unprocessed or minimally processed complex starch and increased protein intake (animal or plant source), fruits, and vegetables.  - he is advised to stick to a routine mealtimes to eat 3 meals  a day and avoid unnecessary snacks ( to snack only to correct hypoglycemia).   - he will be scheduled with Jearld Fenton, RDN, CDE for individualized DM education- consult pending  - I have approached him with the following individualized plan to manage diabetes and patient agrees:   - Based on his commitment and clinical improvement, his treatment can be simplified. - I will change his insulin to Novolin  70/30, 15 units with breakfast and 15 units with supper for pre-meal blood glucose readings above 90 mg/dL, associated with strict monitoring of blood glucose 2 times a  day-before breakfast and before supper.  - Patient is warned not to take insulin without proper monitoring per orders.  -Patient is encouraged to call clinic for blood glucose levels less than 70 or above 300 mg /dl.  -Patient is not a candidate for  SGLT2 inhibitors due to CKD. - he will be considered for metformin if renal function improves, and/or  incretin therapy as appropriate next visit.  - Patient specific target  A1c;  LDL, HDL, Triglycerides, and  Waist Circumference were discussed in detail.  2) BP/HTN: Controlled. Continue current medications including ACEI/ARB. 3) Lipids/HPL:   In 2017 his LDL was 90.  He would have fasting lipid panel on subsequent visits.  Patient is advised to continue statins. 4)  Weight/Diet: CDE Consult will be initiated , exercise, and detailed carbohydrates information provided.  5) hypothyroidism: - His thyroid function tests are consistent with appropriate replacement. -I advised him to continue levothyroxine on 200 g by mouth every morning for now.   - We discussed about correct intake of levothyroxine, at fasting, with water, separated by at least 30 minutes from breakfast, and separated by more than 4 hours from calcium, iron, multivitamins, acid reflux medications (PPIs). -Patient is made aware of the fact that thyroid hormone replacement is needed for life, dose to be adjusted by periodic monitoring of thyroid function tests.  5) Chronic Care/Health Maintenance:  -he  is on ACEI/ARB and Statin medications and  is encouraged to continue to follow up with Ophthalmology, Dentist,  Podiatrist at least yearly or according to recommendations, and advised to   stay away from smoking. I have recommended yearly flu vaccine and pneumonia vaccination at least every 5 years; moderate intensity exercise for up to 150 minutes weekly; and  sleep for at least 7 hours a day.  - Time spent with the patient: 25 min, of which >50% was spent in reviewing his blood  glucose logs , discussing his hypo- and hyper-glycemic episodes, reviewing his current and  previous labs and insulin doses and developing a plan to avoid hypo- and hyper-glycemia. Please refer to Patient Instructions for Blood Glucose Monitoring and Insulin/Medications Dosing Guide"  in media tab for additional information.  - I advised patient to maintain close follow up with Alanda Amass, MD for primary care needs.  Follow up plan: - Return in about 4 months (around 05/19/2018) for follow up with pre-visit labs, meter, and logs.  Glade Lloyd, MD Phone: [phone]  Fax: [phone]   01/17/2018, 5:05 PM This note was partially dictated with voice recognition software. Similar sounding words can be transcribed inadequately or may not  be corrected upon review.

## 2018-03-13 ENCOUNTER — Other Ambulatory Visit: Payer: Self-pay | Admitting: "Endocrinology

## 2018-03-21 LAB — HM DIABETES EYE EXAM

## 2018-05-15 LAB — LIPID PANEL
CHOL/HDL RATIO: 2.8 (calc) (ref <5.0)
CHOLESTEROL: 104 mg/dL (ref <200)
HDL: 37 mg/dL — AB (ref >40)
LDL CHOLESTEROL (CALC): 48 mg/dL
Non-HDL Cholesterol (Calc): 67 mg/dL (calc) (ref <130)
Triglycerides: 100 mg/dL (ref <150)

## 2018-05-15 LAB — HEMOGLOBIN A1C
Hgb A1c MFr Bld: 5.9 % of total Hgb — ABNORMAL HIGH (ref <5.7)
Mean Plasma Glucose: 123 (calc)
eAG (mmol/L): 6.8 (calc)

## 2018-05-15 LAB — COMPLETE METABOLIC PANEL WITH GFR
AG Ratio: 1.9 (calc) (ref 1.0–2.5)
ALBUMIN MSPROF: 3.8 g/dL (ref 3.6–5.1)
ALT: 11 U/L (ref 9–46)
AST: 16 U/L (ref 10–35)
Alkaline phosphatase (APISO): 52 U/L (ref 40–115)
BILIRUBIN TOTAL: 0.4 mg/dL (ref 0.2–1.2)
BUN / CREAT RATIO: 15 (calc) (ref 6–22)
BUN: 29 mg/dL — AB (ref 7–25)
CO2: 24 mmol/L (ref 20–32)
CREATININE: 1.93 mg/dL — AB (ref 0.70–1.18)
Calcium: 8.7 mg/dL (ref 8.6–10.3)
Chloride: 108 mmol/L (ref 98–110)
GFR, EST AFRICAN AMERICAN: 39 mL/min/{1.73_m2} — AB (ref >=60)
GFR, Est Non African American: 34 mL/min/{1.73_m2} — ABNORMAL LOW (ref >=60)
GLOBULIN: 2 g/dL (ref 1.9–3.7)
GLUCOSE: 170 mg/dL — AB (ref 65–99)
Potassium: 4.8 mmol/L (ref 3.5–5.3)
Sodium: 138 mmol/L (ref 135–146)
Total Protein: 5.8 g/dL — ABNORMAL LOW (ref 6.1–8.1)

## 2018-05-15 LAB — MICROALBUMIN / CREATININE URINE RATIO
Creatinine, Urine: 108 mg/dL (ref 20–320)
MICROALB UR: 130.5 mg/dL
Microalb Creat Ratio: 1208 mcg/mg creat — ABNORMAL HIGH (ref <30)

## 2018-05-15 LAB — T4, FREE: Free T4: 1.1 ng/dL (ref 0.8–1.8)

## 2018-05-15 LAB — TSH: TSH: 0.44 mIU/L (ref 0.40–4.50)

## 2018-05-23 ENCOUNTER — Encounter: Payer: Self-pay | Admitting: "Endocrinology

## 2018-05-23 ENCOUNTER — Ambulatory Visit (INDEPENDENT_AMBULATORY_CARE_PROVIDER_SITE_OTHER): Payer: Medicare Other | Admitting: "Endocrinology

## 2018-05-23 VITALS — BP 131/63 | HR 71 | Ht 68.5 in | Wt 261.0 lb

## 2018-05-23 DIAGNOSIS — E782 Mixed hyperlipidemia: Secondary | ICD-10-CM

## 2018-05-23 DIAGNOSIS — E1159 Type 2 diabetes mellitus with other circulatory complications: Secondary | ICD-10-CM | POA: Diagnosis not present

## 2018-05-23 DIAGNOSIS — E1122 Type 2 diabetes mellitus with diabetic chronic kidney disease: Secondary | ICD-10-CM | POA: Diagnosis not present

## 2018-05-23 DIAGNOSIS — Z794 Long term (current) use of insulin: Secondary | ICD-10-CM | POA: Diagnosis not present

## 2018-05-23 DIAGNOSIS — E039 Hypothyroidism, unspecified: Secondary | ICD-10-CM

## 2018-05-23 DIAGNOSIS — I10 Essential (primary) hypertension: Secondary | ICD-10-CM

## 2018-05-23 DIAGNOSIS — N183 Chronic kidney disease, stage 3 (moderate): Secondary | ICD-10-CM

## 2018-05-23 NOTE — Patient Instructions (Signed)

## 2018-05-23 NOTE — Progress Notes (Signed)
Subjective:    Patient ID: Matan Steen, male    DOB: [birth year].  he is being seen in follow-up for management of currently uncontrolled symptomatic diabetes requested by  Roderic Scarce, MD.   Past Medical History:  Diagnosis Date  . Chronic kidney disease, stage 3 ([place])   . Diabetes mellitus, type II ([place])   . Heart attack [facility])    History reviewed. No pertinent surgical history. Social History   Socioeconomic History  . Marital status: Married    Spouse name: Not on file  . Number of children: Not on file  . Years of education: Not on file  . Highest education level: Not on file  Occupational History  . Not on file  Social Needs  . Financial resource strain: Not on file  . Food insecurity:    Worry: Not on file    Inability: Not on file  . Transportation needs:    Medical: Not on file    Non-medical: Not on file  Tobacco Use  . Smoking status: Former Smoker    Last attempt to quit: 08/16/2007    Years since quitting: 10.7  . Smokeless tobacco: Never Used  Substance and Sexual Activity  . Alcohol use: Yes    Comment: Occassional  . Drug use: No  . Sexual activity: Not on file  Lifestyle  . Physical activity:    Days per week: Not on file    Minutes per session: Not on file  . Stress: Not on file  Relationships  . Social connections:    Talks on phone: Not on file    Gets together: Not on file    Attends religious service: Not on file    Active member of club or organization: Not on file    Attends meetings of clubs or organizations: Not on file    Relationship status: Not on file  Other Topics Concern  . Not on file  Social History Narrative  . Not on file   Outpatient Encounter Medications as of 05/23/2018  Medication Sig  . apixaban (ELIQUIS) 5 MG TABS tablet Take 5 mg by mouth 2 (two) times daily.  Marland Kitchen aspirin EC 81 MG tablet Take 81 mg by mouth daily.  . cetirizine (ZYRTEC) 10 MG tablet Take 10 mg by mouth daily.  . Cholecalciferol (VITAMIN  D) 2000 units CAPS Take by mouth daily.  Marland Kitchen docusate sodium (DOCQLACE) 100 MG capsule Take 100 mg by mouth 2 (two) times daily.  . finasteride (PROSCAR) 5 MG tablet Take 5 mg by mouth daily.  . Flaxseed, Linseed, (FLAXSEED OIL PO) Take by mouth daily.  . fluticasone (FLOVENT DISKUS) 50 MCG/BLIST diskus inhaler Inhale 1 puff into the lungs 2 (two) times daily.  . furosemide (LASIX) 20 MG tablet Take 20 mg by mouth every other day.  . Garlic 4680 MG CAPS Take by mouth.  Marland Kitchen HYDROcodone-acetaminophen (NORCO/VICODIN) 5-325 MG tablet Take 1 tablet by mouth every 6 (six) hours as needed for moderate pain.  Marland Kitchen levothyroxine (SYNTHROID, LEVOTHROID) 200 MCG tablet Take 200 mcg by mouth daily before breakfast.  . lisinopril (PRINIVIL,ZESTRIL) 10 MG tablet Take 10 mg by mouth daily.  . Misc Natural Products (BLACK CHERRY CONCENTRATE PO) Take by mouth every morning.  . Multiple Vitamins-Minerals (OCUVITE ADULT 50+ PO) Take by mouth daily.  . naproxen sodium (ANAPROX) 220 MG tablet Take 220 mg by mouth 2 (two) times daily with a meal.  . NOVOLIN 70/30 RELION (70-30) 100 UNIT/ML injection INJECT  15 UNITS INTO THE SKIN TWICE A DAY WITH A MEAL  . pravastatin (PRAVACHOL) 20 MG tablet Take 20 mg by mouth daily.  Marland Kitchen terazosin (HYTRIN) 10 MG capsule Take 10 mg by mouth at bedtime.  . traZODone (DESYREL) 100 MG tablet Take 100 mg by mouth at bedtime.   No facility-administered encounter medications on file as of 05/23/2018.     ALLERGIES: No Known Allergies  VACCINATION STATUS:  There is no immunization history on file for this patient.  Diabetes  He presents for his follow-up diabetic visit. He has type 2 diabetes mellitus. Onset time: He was diagnosed at approximate age of 3 years. His disease course has been improving. There are no hypoglycemic associated symptoms. Pertinent negatives for hypoglycemia include no confusion, headaches, pallor or seizures. Pertinent negatives for diabetes include no chest pain,  no fatigue, no polydipsia, no polyphagia, no polyuria and no weakness. There are no hypoglycemic complications. Symptoms are improving. Diabetic complications include heart disease and nephropathy. Risk factors for coronary artery disease include dyslipidemia, diabetes mellitus, hypertension, male sex, obesity, sedentary lifestyle and tobacco exposure. Current diabetic treatment includes insulin injections and oral agent (monotherapy) (He is currently on Novolin N 20 units twice a day, NovoLog 3-12 units 3 times a day before meals, glipizide 10 mg by mouth twice a day.). His weight is decreasing steadily. He is following a generally unhealthy diet. When asked about meal planning, he reported none. He has not had a previous visit with a dietitian. He never participates in exercise. His breakfast blood glucose range is generally 130-140 mg/dl. His bedtime blood glucose range is generally 130-140 mg/dl. His overall blood glucose range is 130-140 mg/dl. An ACE inhibitor/angiotensin II receptor blocker is being taken.  Hyperlipidemia  This is a chronic problem. The current episode started more than 1 year ago. The problem is uncontrolled. Exacerbating diseases include diabetes, hypothyroidism and obesity. Pertinent negatives include no chest pain, myalgias or shortness of breath. Current antihyperlipidemic treatment includes statins. Risk factors for coronary artery disease include diabetes mellitus, dyslipidemia, hypertension, male sex, obesity and a sedentary lifestyle.  Hypertension  This is a chronic problem. The current episode started more than 1 year ago. Pertinent negatives include no chest pain, headaches, neck pain, palpitations or shortness of breath. Risk factors for coronary artery disease include dyslipidemia, diabetes mellitus, male gender, obesity, sedentary lifestyle and smoking/tobacco exposure. Hypertensive end-organ damage includes kidney disease, CAD/MI and heart failure.    Review of Systems   Constitutional: Negative for chills, fatigue, fever and unexpected weight change.  HENT: Negative for dental problem, mouth sores and trouble swallowing.   Eyes: Negative for visual disturbance.  Respiratory: Negative for cough, choking, chest tightness, shortness of breath and wheezing.   Cardiovascular: Negative for chest pain, palpitations and leg swelling.  Gastrointestinal: Negative for abdominal distention, abdominal pain, constipation, diarrhea, nausea and vomiting.  Endocrine: Negative for polydipsia, polyphagia and polyuria.  Genitourinary: Negative for dysuria, flank pain, hematuria and urgency.  Musculoskeletal: Negative for back pain, gait problem, myalgias and neck pain.  Skin: Negative for pallor, rash and wound.  Neurological: Negative for seizures, syncope, weakness, numbness and headaches.  Psychiatric/Behavioral: Negative.  Negative for confusion and dysphoric mood.    Objective:    BP 131/63   Pulse 71   Ht 5' 8.5" (1.74 m)   Wt 261 lb (118.4 kg)   BMI 39.11 kg/m   Wt Readings from Last 3 Encounters:  05/23/18 261 lb (118.4 kg)  01/17/18 267 lb (121.1  kg)  08/21/17 271 lb (122.9 kg)     Physical Exam  Constitutional: He is oriented to person, place, and time. He appears well-developed. He is cooperative. No distress.  HENT:  Head: Normocephalic and atraumatic.  Eyes: EOM are normal.  Neck: Normal range of motion. Neck supple. No tracheal deviation present. No thyromegaly present.  Cardiovascular: Normal rate, S1 normal and S2 normal. Exam reveals no gallop.  No murmur heard. Pulses:      Dorsalis pedis pulses are 1+ on the right side, and 1+ on the left side.       Posterior tibial pulses are 1+ on the right side, and 1+ on the left side.  Pulmonary/Chest: Effort normal. No respiratory distress. He has no wheezes.  Abdominal: He exhibits no distension. There is no tenderness. There is no guarding and no CVA tenderness.  Musculoskeletal: He exhibits no  edema.       Right shoulder: He exhibits no swelling and no deformity.  Neurological: He is alert and oriented to person, place, and time. He has normal strength and normal reflexes. No cranial nerve deficit or sensory deficit. Gait normal.  Skin: Skin is warm and dry. No rash noted. No cyanosis. Nails show no clubbing.  Psychiatric: He has a normal mood and affect. His speech is normal. Cognition and memory are normal.    Recent Results (from the past 2160 hour(s))  HM DIABETES EYE EXAM     Status: None   Collection Time: 03/21/18 10:45 AM  Result Value Ref Range   HM Diabetic Eye Exam No Retinopathy No Retinopathy  COMPLETE METABOLIC PANEL WITH GFR     Status: Abnormal   Collection Time: 05/13/18  3:54 PM  Result Value Ref Range   Glucose, Bld 170 (H) 65 - 99 mg/dL    Comment: .            Fasting reference interval . For someone without known diabetes, a glucose value >125 mg/dL indicates that they may have diabetes and this should be confirmed with a follow-up test. .    BUN 29 (H) 7 - 25 mg/dL   Creat 1.93 (H) 0.70 - 1.18 mg/dL    Comment: For patients >24 years of age, the reference limit for Creatinine is approximately 13% higher for people identified as African-American. .    GFR, Est Non African American 34 (L) > OR = 60 mL/min/1.67m2   GFR, Est African American 39 (L) > OR = 60 mL/min/1.14m2   BUN/Creatinine Ratio 15 6 - 22 (calc)   Sodium 138 135 - 146 mmol/L   Potassium 4.8 3.5 - 5.3 mmol/L   Chloride 108 98 - 110 mmol/L   CO2 24 20 - 32 mmol/L   Calcium 8.7 8.6 - 10.3 mg/dL   Total Protein 5.8 (L) 6.1 - 8.1 g/dL   Albumin 3.8 3.6 - 5.1 g/dL   Globulin 2.0 1.9 - 3.7 g/dL (calc)   AG Ratio 1.9 1.0 - 2.5 (calc)   Total Bilirubin 0.4 0.2 - 1.2 mg/dL   Alkaline phosphatase (APISO) 52 40 - 115 U/L   AST 16 10 - 35 U/L   ALT 11 9 - 46 U/L  Hemoglobin A1c     Status: Abnormal   Collection Time: 05/13/18  3:54 PM  Result Value Ref Range   Hgb A1c MFr Bld 5.9 (H)  <5.7 % of total Hgb    Comment: For someone without known diabetes, a hemoglobin  A1c value between 5.7% and 6.4%  is consistent with prediabetes and should be confirmed with a  follow-up test. . For someone with known diabetes, a value <7% indicates that their diabetes is well controlled. A1c targets should be individualized based on duration of diabetes, age, comorbid conditions, and other considerations. . This assay result is consistent with an increased risk of diabetes. . Currently, no consensus exists regarding use of hemoglobin A1c for diagnosis of diabetes for children. .    Mean Plasma Glucose 123 (calc)   eAG (mmol/L) 6.8 (calc)  Lipid panel     Status: Abnormal   Collection Time: 05/13/18  3:54 PM  Result Value Ref Range   Cholesterol 104 <200 mg/dL   HDL 37 (L) >40 mg/dL   Triglycerides 100 <150 mg/dL   LDL Cholesterol (Calc) 48 mg/dL (calc)    Comment: Reference range: <100 . Desirable range <100 mg/dL for primary prevention;   <70 mg/dL for patients with CHD or diabetic patients  with > or = 2 CHD risk factors. Marland Kitchen LDL-C is now calculated using the Martin-Hopkins  calculation, which is a validated novel method providing  better accuracy than the Friedewald equation in the  estimation of LDL-C.  Cresenciano Genre et al. Annamaria Helling. 8119;147(82): 2061-2068  (http://education.[facility].com/faq/FAQ164)    Total CHOL/HDL Ratio 2.8 <5.0 (calc)   Non-HDL Cholesterol (Calc) 67 <130 mg/dL (calc)    Comment: For patients with diabetes plus 1 major ASCVD risk  factor, treating to a non-HDL-C goal of <100 mg/dL  (LDL-C of <70 mg/dL) is considered a therapeutic  option.   Microalbumin / creatinine urine ratio     Status: Abnormal   Collection Time: 05/13/18  3:54 PM  Result Value Ref Range   Creatinine, Urine 108 20 - 320 mg/dL   Microalb, Ur 130.5 mg/dL    Comment: Verified by repeat analysis. Marland Kitchen Reference Range Not established    Microalb Creat Ratio 1,208 (H) <30  mcg/mg creat    Comment: . The ADA defines abnormalities in albumin excretion as follows: Marland Kitchen Category         Result (mcg/mg creatinine) . Normal                    <30 Microalbuminuria         30-299  Clinical albuminuria   > OR = 300 . The ADA recommends that at least two of three specimens collected within a 3-6 month period be abnormal before considering a patient to be within a diagnostic category.   TSH     Status: None   Collection Time: 05/13/18  3:54 PM  Result Value Ref Range   TSH 0.44 0.40 - 4.50 mIU/L  T4, free     Status: None   Collection Time: 05/13/18  3:54 PM  Result Value Ref Range   Free T4 1.1 0.8 - 1.8 ng/dL     Assessment & Plan:   1. DM type 2 causing vascular disease ([place]) - Patient has currently uncontrolled symptomatic type 2 DM since  72 years of age. - Patient came with continued improvement in his blood glucose profile, A1c at 5.9%,  Overall improved his A1c from 9.5% overall.   -his diabetes is complicated by coronary artery disease, CKD, obesity/sedentary life and Jarmarcus Wambold remains at a high risk for more acute and chronic complications which include CAD, CVA, CKD, retinopathy, and neuropathy. These are all discussed in detail with the patient.  - I have counseled him on diet management and weight loss, by adopting  a carbohydrate restricted/protein rich diet.  -  Suggestion is made for him to avoid simple carbohydrates  from his diet including Cakes, Sweet Desserts / Pastries, Ice Cream, Soda (diet and regular), Sweet Tea, Candies, Chips, Cookies, Store Bought Juices, Alcohol in Excess of  1-2 drinks a day, Artificial Sweeteners, and "Sugar-free" Products. This will help patient to have stable blood glucose profile and potentially avoid unintended weight gain.  - I encouraged him to switch to  unprocessed or minimally processed complex starch and increased protein intake (animal or plant source), fruits, and vegetables.  - he is advised to  stick to a routine mealtimes to eat 3 meals  a day and avoid unnecessary snacks ( to snack only to correct hypoglycemia).    - I have approached him with the following individualized plan to manage diabetes and patient agrees:  -  - I will change his insulin to Novolin  70/30, 15 units with breakfast and 15 units with supper for pre-meal blood glucose readings above 90 mg/dL, associated with strict monitoring of blood glucose 2 times a day-before breakfast and before supper.  - Patient is warned not to take insulin without proper monitoring per orders.  -Patient is encouraged to call clinic for blood glucose levels less than 70 or above 300 mg /dl.  -Patient is not a candidate for  SGLT2 inhibitors due to CKD. - he will be considered for metformin if renal function improves, and/or  incretin therapy as appropriate next visit.  - Patient specific target  A1c;  LDL, HDL, Triglycerides, and  Waist Circumference were discussed in detail.  2) BP/HTN: His blood pressure is controlled to target.  He is advised to continue his current blood pressure medications including lisinopril 10 mg p.o. daily.   3) Lipids/HPL: His most recent lipid panel showed controlled LDL at 48 improving from 90.  He will continue to benefit from pravastatin 20 mg p.o. nightly.   4)  Weight/Diet: CDE Consult will be initiated , exercise, and detailed carbohydrates information provided.  5) hypothyroidism: - His thyroid function tests are consistent with appropriate replacement. -I advised him to continue levothyroxine on 200 g p.o. every morning before breakfast.     - We discussed about correct intake of levothyroxine, at fasting, with water, separated by at least 30 minutes from breakfast, and separated by more than 4 hours from calcium, iron, multivitamins, acid reflux medications (PPIs). -Patient is made aware of the fact that thyroid hormone replacement is needed for life, dose to be adjusted by periodic  monitoring of thyroid function tests.  5) Chronic Care/Health Maintenance:  -he  is on ACEI/ARB and Statin medications and  is encouraged to continue to follow up with Ophthalmology, Dentist,  Podiatrist at least yearly or according to recommendations, and advised to   stay away from smoking. I have recommended yearly flu vaccine and pneumonia vaccination at least every 5 years; moderate intensity exercise for up to 150 minutes weekly; and  sleep for at least 7 hours a day.  - I advised patient to maintain close follow up with Roderic Scarce, MD for primary care needs.  - Time spent with the patient: 25 min, of which >50% was spent in reviewing his blood glucose logs , discussing his hypo- and hyper-glycemic episodes, reviewing his current and  previous labs and insulin doses and developing a plan to avoid hypo- and hyper-glycemia. Please refer to Patient Instructions for Blood Glucose Monitoring and Insulin/Medications Dosing Guide"  in media tab  for additional information. Zackery Brine participated in the discussions, expressed understanding, and voiced agreement with the above plans.  All questions were answered to his satisfaction. he is encouraged to contact clinic should he have any questions or concerns prior to his return visit.   Follow up plan: - Return in about 4 months (around 09/23/2018) for follow up with pre-visit labs, meter, and logs.  Glade Lloyd, MD Phone: [phone]  Fax: [phone]   05/23/2018, 3:55 PM This note was partially dictated with voice recognition software. Similar sounding words can be transcribed inadequately or may not  be corrected upon review.

## 2018-06-20 ENCOUNTER — Other Ambulatory Visit: Payer: Self-pay | Admitting: "Endocrinology

## 2018-09-03 ENCOUNTER — Other Ambulatory Visit: Payer: Self-pay | Admitting: "Endocrinology

## 2018-09-25 LAB — T4, FREE: FREE T4: 1.3 ng/dL (ref 0.8–1.8)

## 2018-09-25 LAB — COMPLETE METABOLIC PANEL WITH GFR
AG Ratio: 1.5 (calc) (ref 1.0–2.5)
ALBUMIN MSPROF: 3.8 g/dL (ref 3.6–5.1)
ALKALINE PHOSPHATASE (APISO): 72 U/L (ref 40–115)
ALT: 19 U/L (ref 9–46)
AST: 23 U/L (ref 10–35)
BILIRUBIN TOTAL: 0.3 mg/dL (ref 0.2–1.2)
BUN / CREAT RATIO: 15 (calc) (ref 6–22)
BUN: 31 mg/dL — AB (ref 7–25)
CHLORIDE: 108 mmol/L (ref 98–110)
CO2: 30 mmol/L (ref 20–32)
Calcium: 9.2 mg/dL (ref 8.6–10.3)
Creat: 2.1 mg/dL — ABNORMAL HIGH (ref 0.70–1.18)
GFR, Est African American: 35 mL/min/{1.73_m2} — ABNORMAL LOW (ref >=60)
GFR, Est Non African American: 31 mL/min/{1.73_m2} — ABNORMAL LOW (ref >=60)
GLUCOSE: 127 mg/dL (ref 65–139)
Globulin: 2.6 g/dL (calc) (ref 1.9–3.7)
Potassium: 4.4 mmol/L (ref 3.5–5.3)
Sodium: 143 mmol/L (ref 135–146)
Total Protein: 6.4 g/dL (ref 6.1–8.1)

## 2018-09-25 LAB — TSH: TSH: 0.14 mIU/L — ABNORMAL LOW (ref 0.40–4.50)

## 2018-09-25 LAB — HEMOGLOBIN A1C
HEMOGLOBIN A1C: 6.8 %{Hb} — AB (ref <5.7)
MEAN PLASMA GLUCOSE: 148 (calc)
eAG (mmol/L): 8.2 (calc)

## 2018-09-30 ENCOUNTER — Ambulatory Visit: Payer: Medicare Other | Admitting: "Endocrinology

## 2018-10-04 ENCOUNTER — Encounter: Payer: Self-pay | Admitting: "Endocrinology

## 2018-10-04 ENCOUNTER — Ambulatory Visit (INDEPENDENT_AMBULATORY_CARE_PROVIDER_SITE_OTHER): Payer: Medicare Other | Admitting: "Endocrinology

## 2018-10-04 VITALS — BP 149/73 | HR 64 | Ht 68.5 in | Wt 260.0 lb

## 2018-10-04 DIAGNOSIS — I10 Essential (primary) hypertension: Secondary | ICD-10-CM

## 2018-10-04 DIAGNOSIS — Z794 Long term (current) use of insulin: Secondary | ICD-10-CM

## 2018-10-04 DIAGNOSIS — N183 Chronic kidney disease, stage 3 (moderate): Secondary | ICD-10-CM

## 2018-10-04 DIAGNOSIS — E1159 Type 2 diabetes mellitus with other circulatory complications: Secondary | ICD-10-CM | POA: Diagnosis not present

## 2018-10-04 DIAGNOSIS — E039 Hypothyroidism, unspecified: Secondary | ICD-10-CM | POA: Diagnosis not present

## 2018-10-04 DIAGNOSIS — E1122 Type 2 diabetes mellitus with diabetic chronic kidney disease: Secondary | ICD-10-CM | POA: Diagnosis not present

## 2018-10-04 DIAGNOSIS — E782 Mixed hyperlipidemia: Secondary | ICD-10-CM

## 2018-10-04 MED ORDER — LEVOTHYROXINE SODIUM 175 MCG PO TABS: 175.0000 ug | ORAL_TABLET | Freq: Every day | ORAL | 3 refills | Status: DC

## 2018-10-04 NOTE — Progress Notes (Signed)
Endocrinology follow-up note  Subjective:    Patient ID: Bryce Mejia, male    DOB: 1946-08-13.  he is being seen in follow-up for management of currently uncontrolled symptomatic type 2 diabetes, hypothyroidism, hyperlipidemia, hypertension.  PMD:  Roderic Scarce, MD.   Past Medical History:  Diagnosis Date  . Chronic kidney disease, stage 3 ([place])   . Diabetes mellitus, type II ([place])   . Heart attack [facility])    History reviewed. No pertinent surgical history. Social History   Socioeconomic History  . Marital status: Married    Spouse name: Not on file  . Number of children: Not on file  . Years of education: Not on file  . Highest education level: Not on file  Occupational History  . Not on file  Social Needs  . Financial resource strain: Not on file  . Food insecurity:    Worry: Not on file    Inability: Not on file  . Transportation needs:    Medical: Not on file    Non-medical: Not on file  Tobacco Use  . Smoking status: Former Smoker    Last attempt to quit: 08/16/2007    Years since quitting: 11.1  . Smokeless tobacco: Never Used  Substance and Sexual Activity  . Alcohol use: Yes    Comment: Occassional  . Drug use: No  . Sexual activity: Not on file  Lifestyle  . Physical activity:    Days per week: Not on file    Minutes per session: Not on file  . Stress: Not on file  Relationships  . Social connections:    Talks on phone: Not on file    Gets together: Not on file    Attends religious service: Not on file    Active member of club or organization: Not on file    Attends meetings of clubs or organizations: Not on file    Relationship status: Not on file  Other Topics Concern  . Not on file  Social History Narrative  . Not on file   Outpatient Encounter Medications as of 10/04/2018  Medication Sig  . apixaban (ELIQUIS) 5 MG TABS tablet Take 5 mg by mouth 2 (two) times daily.  Marland Kitchen aspirin EC 81 MG tablet Take 81 mg by mouth daily.  .  cetirizine (ZYRTEC) 10 MG tablet Take 10 mg by mouth daily.  . Cholecalciferol (VITAMIN D) 2000 units CAPS Take by mouth daily.  Marland Kitchen docusate sodium (DOCQLACE) 100 MG capsule Take 100 mg by mouth 2 (two) times daily.  . finasteride (PROSCAR) 5 MG tablet Take 5 mg by mouth daily.  . Flaxseed, Linseed, (FLAXSEED OIL PO) Take by mouth daily.  . fluticasone (FLOVENT DISKUS) 50 MCG/BLIST diskus inhaler Inhale 1 puff into the lungs 2 (two) times daily.  . furosemide (LASIX) 20 MG tablet Take 20 mg by mouth every other day.  . Garlic 7741 MG CAPS Take by mouth.  Marland Kitchen HYDROcodone-acetaminophen (NORCO/VICODIN) 5-325 MG tablet Take 1 tablet by mouth every 6 (six) hours as needed for moderate pain.  Marland Kitchen levothyroxine (SYNTHROID, LEVOTHROID) 175 MCG tablet Take 1 tablet (175 mcg total) by mouth daily before breakfast.  . lisinopril (PRINIVIL,ZESTRIL) 10 MG tablet Take 10 mg by mouth daily.  . Misc Natural Products (BLACK CHERRY CONCENTRATE PO) Take by mouth every morning.  . Multiple Vitamins-Minerals (OCUVITE ADULT 50+ PO) Take by mouth daily.  Marland Kitchen NOVOLIN 70/30 RELION (70-30) 100 UNIT/ML injection INJECT 15 UNITS SUBCUTANEOUSLY TWICE DAILY WITH MEALS  .  pravastatin (PRAVACHOL) 20 MG tablet Take 20 mg by mouth daily.  Marland Kitchen terazosin (HYTRIN) 10 MG capsule Take 10 mg by mouth at bedtime.  . traZODone (DESYREL) 100 MG tablet Take 100 mg by mouth at bedtime.  . [DISCONTINUED] levothyroxine (SYNTHROID, LEVOTHROID) 200 MCG tablet Take 200 mcg by mouth daily before breakfast.  . [DISCONTINUED] naproxen sodium (ANAPROX) 220 MG tablet Take 220 mg by mouth 2 (two) times daily with a meal.   No facility-administered encounter medications on file as of 10/04/2018.     ALLERGIES: No Known Allergies  VACCINATION STATUS:  There is no immunization history on file for this patient.  Diabetes  He presents for his follow-up diabetic visit. He has type 2 diabetes mellitus. Onset time: He was diagnosed at approximate age of 72  years. His disease course has been stable. There are no hypoglycemic associated symptoms. Pertinent negatives for hypoglycemia include no confusion, headaches, pallor or seizures. Pertinent negatives for diabetes include no chest pain, no fatigue, no polydipsia, no polyphagia, no polyuria and no weakness. There are no hypoglycemic complications. Symptoms are stable. Diabetic complications include heart disease and nephropathy. Risk factors for coronary artery disease include dyslipidemia, diabetes mellitus, hypertension, male sex, obesity, sedentary lifestyle and tobacco exposure. Current diabetic treatment includes insulin injections and oral agent (monotherapy) (He is currently on Novolin N 20 units twice a day, NovoLog 3-12 units 3 times a day before meals, glipizide 10 mg by mouth twice a day.). His weight is decreasing steadily. He is following a generally unhealthy diet. When asked about meal planning, he reported none. He has not had a previous visit with a dietitian. He never participates in exercise. His breakfast blood glucose range is generally 130-140 mg/dl. His bedtime blood glucose range is generally 130-140 mg/dl. His overall blood glucose range is 130-140 mg/dl. An ACE inhibitor/angiotensin II receptor blocker is being taken.  Hyperlipidemia  This is a chronic problem. The current episode started more than 1 year ago. The problem is uncontrolled. Exacerbating diseases include diabetes, hypothyroidism and obesity. Pertinent negatives include no chest pain, myalgias or shortness of breath. Current antihyperlipidemic treatment includes statins. Risk factors for coronary artery disease include diabetes mellitus, dyslipidemia, hypertension, male sex, obesity and a sedentary lifestyle.  Hypertension  This is a chronic problem. The current episode started more than 1 year ago. Pertinent negatives include no chest pain, headaches, neck pain, palpitations or shortness of breath. Risk factors for coronary  artery disease include dyslipidemia, diabetes mellitus, male gender, obesity, sedentary lifestyle and smoking/tobacco exposure. Hypertensive end-organ damage includes kidney disease, CAD/MI and heart failure.    Review of Systems  Constitutional: Negative for chills, fatigue, fever and unexpected weight change.  HENT: Negative for dental problem, mouth sores and trouble swallowing.   Eyes: Negative for visual disturbance.  Respiratory: Negative for cough, choking, chest tightness, shortness of breath and wheezing.   Cardiovascular: Negative for chest pain, palpitations and leg swelling.  Gastrointestinal: Negative for abdominal distention, abdominal pain, constipation, diarrhea, nausea and vomiting.  Endocrine: Negative for polydipsia, polyphagia and polyuria.  Genitourinary: Negative for dysuria, flank pain, hematuria and urgency.  Musculoskeletal: Negative for back pain, gait problem, myalgias and neck pain.  Skin: Negative for pallor, rash and wound.  Neurological: Negative for seizures, syncope, weakness, numbness and headaches.  Psychiatric/Behavioral: Negative.  Negative for confusion and dysphoric mood.    Objective:    BP (!) 149/73   Pulse 64   Ht 5' 8.5" (1.74 m)   Wt 260 lb (  117.9 kg)   BMI 38.96 kg/m   Wt Readings from Last 3 Encounters:  10/04/18 260 lb (117.9 kg)  05/23/18 261 lb (118.4 kg)  01/17/18 267 lb (121.1 kg)     Physical Exam  Constitutional: He is oriented to person, place, and time. He appears well-developed. He is cooperative. No distress.  HENT:  Head: Normocephalic and atraumatic.  Eyes: EOM are normal.  Neck: Normal range of motion. Neck supple. No tracheal deviation present. No thyromegaly present.  Cardiovascular: Normal rate, S1 normal and S2 normal. Exam reveals no gallop.  No murmur heard. Pulses:      Dorsalis pedis pulses are 1+ on the right side, and 1+ on the left side.       Posterior tibial pulses are 1+ on the right side, and 1+ on  the left side.  Pulmonary/Chest: Effort normal. No respiratory distress. He has no wheezes.  Abdominal: He exhibits no distension. There is no tenderness. There is no guarding and no CVA tenderness.  Musculoskeletal: He exhibits no edema.       Right shoulder: He exhibits no swelling and no deformity.  Neurological: He is alert and oriented to person, place, and time. He has normal strength and normal reflexes. No cranial nerve deficit or sensory deficit. Gait normal.  Skin: Skin is warm and dry. No rash noted. No cyanosis. Nails show no clubbing.  Psychiatric: He has a normal mood and affect. His speech is normal. Cognition and memory are normal.    Recent Results (from the past 2160 hour(s))  COMPLETE METABOLIC PANEL WITH GFR     Status: Abnormal   Collection Time: 09/24/18  3:53 PM  Result Value Ref Range   Glucose, Bld 127 65 - 139 mg/dL    Comment: .        Non-fasting reference interval .    BUN 31 (H) 7 - 25 mg/dL   Creat 2.10 (H) 0.70 - 1.18 mg/dL    Comment: For patients >45 years of age, the reference limit for Creatinine is approximately 13% higher for people identified as African-American. .    GFR, Est Non African American 31 (L) > OR = 60 mL/min/1.24m2   GFR, Est African American 35 (L) > OR = 60 mL/min/1.48m2   BUN/Creatinine Ratio 15 6 - 22 (calc)   Sodium 143 135 - 146 mmol/L   Potassium 4.4 3.5 - 5.3 mmol/L   Chloride 108 98 - 110 mmol/L   CO2 30 20 - 32 mmol/L   Calcium 9.2 8.6 - 10.3 mg/dL   Total Protein 6.4 6.1 - 8.1 g/dL   Albumin 3.8 3.6 - 5.1 g/dL   Globulin 2.6 1.9 - 3.7 g/dL (calc)   AG Ratio 1.5 1.0 - 2.5 (calc)   Total Bilirubin 0.3 0.2 - 1.2 mg/dL   Alkaline phosphatase (APISO) 72 40 - 115 U/L   AST 23 10 - 35 U/L   ALT 19 9 - 46 U/L  Hemoglobin A1c     Status: Abnormal   Collection Time: 09/24/18  3:53 PM  Result Value Ref Range   Hgb A1c MFr Bld 6.8 (H) <5.7 % of total Hgb    Comment: For someone without known diabetes, a hemoglobin  A1c value of 6.5% or greater indicates that they may have  diabetes and this should be confirmed with a follow-up  test. . For someone with known diabetes, a value <7% indicates  that their diabetes is well controlled and a value  greater than  or equal to 7% indicates suboptimal  control. A1c targets should be individualized based on  duration of diabetes, age, comorbid conditions, and  other considerations. . Currently, no consensus exists regarding use of hemoglobin A1c for diagnosis of diabetes for children. .    Mean Plasma Glucose 148 (calc)   eAG (mmol/L) 8.2 (calc)  T4, free     Status: None   Collection Time: 09/24/18  3:53 PM  Result Value Ref Range   Free T4 1.3 0.8 - 1.8 ng/dL  TSH     Status: Abnormal   Collection Time: 09/24/18  3:53 PM  Result Value Ref Range   TSH 0.14 (L) 0.40 - 4.50 mIU/L     Assessment & Plan:   1. DM type 2 causing vascular disease ([place]) - Patient has currently uncontrolled symptomatic type 2 DM since  72 years of age. - Patient came with continued improvement in his blood glucose profile, A1c at 6.8% ,  Overall improved his A1c from 9.5% overall.   -his diabetes is complicated by coronary artery disease, CKD, obesity/sedentary life and Bryce Mejia remains at a high risk for more acute and chronic complications which include CAD, CVA, CKD, retinopathy, and neuropathy. These are all discussed in detail with the patient.  - I have counseled him on diet management and weight loss, by adopting a carbohydrate restricted/protein rich diet.  -  Suggestion is made for him to avoid simple carbohydrates  from his diet including Cakes, Sweet Desserts / Pastries, Ice Cream, Soda (diet and regular), Sweet Tea, Candies, Chips, Cookies, Store Bought Juices, Alcohol in Excess of  1-2 drinks a day, Artificial Sweeteners, and "Sugar-free" Products. This will help patient to have stable blood glucose profile and potentially avoid unintended weight  gain.  - I encouraged him to switch to  unprocessed or minimally processed complex starch and increased protein intake (animal or plant source), fruits, and vegetables.  - he is advised to stick to a routine mealtimes to eat 3 meals  a day and avoid unnecessary snacks ( to snack only to correct hypoglycemia).    - I have approached him with the following individualized plan to manage diabetes and patient agrees:   -He has done very well on Novolin 70/30.  He is advised to continue Novolin  70/30, 15 units with breakfast and 15 units with supper for pre-meal blood glucose readings above 90 mg/dL, associated with strict monitoring of blood glucose 2 times a day-before breakfast and before supper.  - Patient is warned not to take insulin without proper monitoring per orders.  -Patient is encouraged to call clinic for blood glucose levels less than 70 or above 300 mg /dl.  -Patient is not a candidate for  SGLT2 inhibitors due to CKD.  He is advised to discontinue naproxen he is taking for pain which may worsen his renal function. - he will be considered for incretin therapy as appropriate next visit.  - Patient specific target  A1c;  LDL, HDL, Triglycerides, and  Waist Circumference were discussed in detail.  2) BP/HTN: His blood pressure is not controlled to target.   He is advised to continue his current blood pressure medications including lisinopril 10 mg p.o. daily.   3) Lipids/HPL: His most recent lipid panel showed controlled LDL at 48 improving from 90.  He is advised to continue  pravastatin 20 mg p.o. nightly.   4)  Weight/Diet: CDE Consult will be initiated , exercise, and detailed carbohydrates information provided.  5)  hypothyroidism: - His thyroid function tests are consistent with over replacement.  This may be due to the fact that he lost 20+ pounds since last year.   -I discussed and lowered his levothyroxine to 175 mcg p.o. every morning .    - We discussed about correct  intake of levothyroxine, at fasting, with water, separated by at least 30 minutes from breakfast, and separated by more than 4 hours from calcium, iron, multivitamins, acid reflux medications (PPIs). -Patient is made aware of the fact that thyroid hormone replacement is needed for life, dose to be adjusted by periodic monitoring of thyroid function tests.  5) Chronic Care/Health Maintenance:  -he  is on ACEI/ARB and Statin medications and  is encouraged to continue to follow up with Ophthalmology, Dentist,  Podiatrist at least yearly or according to recommendations, and advised to   stay away from smoking. I have recommended yearly flu vaccine and pneumonia vaccination at least every 5 years; moderate intensity exercise for up to 150 minutes weekly; and  sleep for at least 7 hours a day.  - I advised patient to maintain close follow up with Roderic Scarce, MD for primary care needs.  - Time spent with the patient: 25 min, of which >50% was spent in reviewing his blood glucose logs , discussing his hypo- and hyper-glycemic episodes, reviewing his current and  previous labs and insulin doses and developing a plan to avoid hypo- and hyper-glycemia. Please refer to Patient Instructions for Blood Glucose Monitoring and Insulin/Medications Dosing Guide"  in media tab for additional information. Bryce Mejia participated in the discussions, expressed understanding, and voiced agreement with the above plans.  All questions were answered to his satisfaction. he is encouraged to contact clinic should he have any questions or concerns prior to his return visit.   Follow up plan: - Return in about 3 months (around 01/04/2019) for Meter, and Logs, Follow up with Pre-visit Labs, Meter, and Logs.  Glade Lloyd, MD Phone: [phone]  Fax: [phone]   10/04/2018, 1:04 PM This note was partially dictated with voice recognition software. Similar sounding words can be transcribed inadequately or may not  be  corrected upon review.

## 2018-10-04 NOTE — Patient Instructions (Signed)

## 2018-12-30 ENCOUNTER — Emergency Department (HOSPITAL_COMMUNITY): Payer: Medicare Other

## 2018-12-30 ENCOUNTER — Encounter (HOSPITAL_COMMUNITY): Payer: Self-pay

## 2018-12-30 ENCOUNTER — Other Ambulatory Visit: Payer: Self-pay

## 2018-12-30 ENCOUNTER — Emergency Department (HOSPITAL_COMMUNITY)
Admission: EM | Admit: 2018-12-30 | Discharge: 2018-12-30 | Disposition: A | Discharge status: Home / Self Care | Payer: Medicare Other | Attending: Emergency Medicine | Admitting: Emergency Medicine

## 2018-12-30 DIAGNOSIS — Z7982 Long term (current) use of aspirin: Secondary | ICD-10-CM | POA: Diagnosis not present

## 2018-12-30 DIAGNOSIS — Z87891 Personal history of nicotine dependence: Secondary | ICD-10-CM | POA: Diagnosis not present

## 2018-12-30 DIAGNOSIS — Z79899 Other long term (current) drug therapy: Secondary | ICD-10-CM | POA: Diagnosis not present

## 2018-12-30 DIAGNOSIS — S8001XA Contusion of right knee, initial encounter: Secondary | ICD-10-CM | POA: Diagnosis not present

## 2018-12-30 DIAGNOSIS — W11XXXA Fall on and from ladder, initial encounter: Secondary | ICD-10-CM | POA: Diagnosis not present

## 2018-12-30 DIAGNOSIS — E1122 Type 2 diabetes mellitus with diabetic chronic kidney disease: Secondary | ICD-10-CM | POA: Insufficient documentation

## 2018-12-30 DIAGNOSIS — N183 Chronic kidney disease, stage 3 (moderate): Secondary | ICD-10-CM | POA: Diagnosis not present

## 2018-12-30 DIAGNOSIS — Z794 Long term (current) use of insulin: Secondary | ICD-10-CM | POA: Diagnosis not present

## 2018-12-30 DIAGNOSIS — H1131 Conjunctival hemorrhage, right eye: Secondary | ICD-10-CM | POA: Diagnosis not present

## 2018-12-30 DIAGNOSIS — S01111A Laceration without foreign body of right eyelid and periocular area, initial encounter: Secondary | ICD-10-CM | POA: Insufficient documentation

## 2018-12-30 DIAGNOSIS — S0993XA Unspecified injury of face, initial encounter: Secondary | ICD-10-CM | POA: Diagnosis present

## 2018-12-30 DIAGNOSIS — M25512 Pain in left shoulder: Secondary | ICD-10-CM | POA: Diagnosis not present

## 2018-12-30 DIAGNOSIS — W19XXXA Unspecified fall, initial encounter: Secondary | ICD-10-CM

## 2018-12-30 DIAGNOSIS — I129 Hypertensive chronic kidney disease with stage 1 through stage 4 chronic kidney disease, or unspecified chronic kidney disease: Secondary | ICD-10-CM | POA: Diagnosis not present

## 2018-12-30 DIAGNOSIS — Z7901 Long term (current) use of anticoagulants: Secondary | ICD-10-CM | POA: Insufficient documentation

## 2018-12-30 DIAGNOSIS — S0083XA Contusion of other part of head, initial encounter: Secondary | ICD-10-CM | POA: Diagnosis not present

## 2018-12-30 DIAGNOSIS — Y93H3 Activity, building and construction: Secondary | ICD-10-CM | POA: Diagnosis not present

## 2018-12-30 DIAGNOSIS — M542 Cervicalgia: Secondary | ICD-10-CM | POA: Insufficient documentation

## 2018-12-30 DIAGNOSIS — E039 Hypothyroidism, unspecified: Secondary | ICD-10-CM | POA: Insufficient documentation

## 2018-12-30 DIAGNOSIS — S0990XA Unspecified injury of head, initial encounter: Secondary | ICD-10-CM | POA: Insufficient documentation

## 2018-12-30 DIAGNOSIS — Y999 Unspecified external cause status: Secondary | ICD-10-CM | POA: Diagnosis not present

## 2018-12-30 DIAGNOSIS — Y92018 Other place in single-family (private) house as the place of occurrence of the external cause: Secondary | ICD-10-CM | POA: Diagnosis not present

## 2018-12-30 MED ORDER — ACETAMINOPHEN 325 MG PO TABS
650.0000 mg | ORAL_TABLET | Freq: Once | ORAL | Status: AC
  Administered 2018-12-30: 650 mg via ORAL
  Filled 2018-12-30: qty 2

## 2018-12-30 NOTE — ED Triage Notes (Addendum)
Pt reports that he was taking his deck down and fell through the boards and caught at the waist. Pt was able to get up and walked across deck and fell through boards again and with this fall he hit his head on ladder. Denies loss of consciousness. Pt reports he is on eliquis. Pt has laceration above right brow and swelling/bruising to right eye. Reports right knee pain

## 2018-12-30 NOTE — Discharge Instructions (Addendum)
You were seen in the emergency department for injuries from a fall at home.  You had a CAT scan of your head and cervical spine along with x-rays of your right knee.  There was no evidence of any bleeding on the brain and your cervical spine did not show any fractures.  Your knee showed some arthritis but no obvious fractures.  You should use ice to the affected areas and can use Tylenol for pain.  They did see a pulmonary nodule when they were doing a CAT scan and this will need to be followed up with your doctor.  You are at high risk for bleeding being on a blood thinner so if you have any worsening symptoms please return to the emergency department.  IMPRESSION:  1. No acute intracranial findings or skull fracture.  2. A degenerative cervical spondylosis but no acute cervical spine  fracture.  3. Left upper lobe pulmonary nodules are noted. Recommend full chest  CT (non urgent).

## 2018-12-30 NOTE — ED Notes (Signed)
Pt ambulated in hallway and tolerated well. Stated his knee felt much better and he was "ready to go". Dr Melina Copa made aware.

## 2018-12-30 NOTE — ED Provider Notes (Signed)
[facility] EMERGENCY DEPARTMENT Provider Note   CSN: [id number] Arrival date & time: 12/30/18  1731    History   Chief Complaint Chief Complaint  Patient presents with  . Fall  . Head Laceration    HPI Bryce Mejia is a 73 y.o. male.  He is here after 2 falls at home while working on an outside deck.  He said he was cutting some boards and fell through a weakened area and wrenched his right knee.  He was able to extricate himself from that and after he felt better he was working in another area where he fell through again and struck his head on a ladder.  There was no loss of consciousness.  He is complaining of some pain in his forehead and pain behind his left shoulder and pain of his right knee.Marland Kitchen No chest pain or shortness of breath.  No abdominal pain.  He is on Eliquis.  He has been able to ambulate with some discomfort.     The history is provided by the patient.  Fall  This is a new problem. The current episode started 3 to 5 hours ago. The problem has been gradually improving. Pertinent negatives include no chest pain, no abdominal pain, no headaches and no shortness of breath. The symptoms are aggravated by walking. The symptoms are relieved by rest. He has tried rest for the symptoms. The treatment provided moderate relief.  Head Laceration  This is a new problem. The current episode started 3 to 5 hours ago. The problem has been gradually improving. Pertinent negatives include no chest pain, no abdominal pain, no headaches and no shortness of breath. Nothing aggravates the symptoms. The symptoms are relieved by rest. He has tried rest for the symptoms. The treatment provided moderate relief.    Past Medical History:  Diagnosis Date  . Chronic kidney disease, stage 3 (HCC)    CKD stage 3  . Diabetes mellitus, type II ([place])   . Heart attack [facility])     Patient Active Problem List   Diagnosis Date Noted  . Type 2 diabetes mellitus with stage 3 chronic kidney disease,  with long-term current use of insulin ([place]) 01/17/2018  . DM type 2 causing vascular disease ([place]) 08/15/2017  . Type 2 diabetes mellitus with chronic kidney disease, with long-term current use of insulin ([place]) 08/15/2017  . Essential hypertension, benign 08/15/2017  . Mixed hyperlipidemia 08/15/2017  . Hypothyroidism 08/15/2017  . Morbid obesity ([place]) 08/15/2017    History reviewed. No pertinent surgical history.      Home Medications    Prior to Admission medications   Medication Sig Start Date End Date Taking? Authorizing Provider  apixaban (ELIQUIS) 5 MG TABS tablet Take 5 mg by mouth 2 (two) times daily.    [provider]  aspirin EC 81 MG tablet Take 81 mg by mouth daily.    [provider]  cetirizine (ZYRTEC) 10 MG tablet Take 10 mg by mouth daily.    [provider]  Cholecalciferol (VITAMIN D) 2000 units CAPS Take by mouth daily.    [provider]  docusate sodium (DOCQLACE) 100 MG capsule Take 100 mg by mouth 2 (two) times daily.    [provider]  finasteride (PROSCAR) 5 MG tablet Take 5 mg by mouth daily.    [provider]  Flaxseed, Linseed, (FLAXSEED OIL PO) Take by mouth daily.    [provider]  fluticasone (FLOVENT DISKUS) 50 MCG/BLIST diskus inhaler Inhale 1  puff into the lungs 2 (two) times daily.    [provider]  furosemide (LASIX) 20 MG tablet Take 20 mg by mouth every other day.    [provider]  Garlic 4782 MG CAPS Take by mouth.    [provider]  HYDROcodone-acetaminophen (NORCO/VICODIN) 5-325 MG tablet Take 1 tablet by mouth every 6 (six) hours as needed for moderate pain.    [provider]  levothyroxine (SYNTHROID, LEVOTHROID) 175 MCG tablet Take 1 tablet (175 mcg total) by mouth daily before breakfast. 10/04/18   Nida, Marella Chimes, MD  lisinopril (PRINIVIL,ZESTRIL) 10 MG tablet Take 10 mg by mouth daily.    [provider]  Misc  Natural Products (BLACK CHERRY CONCENTRATE PO) Take by mouth every morning.    [provider]  Multiple Vitamins-Minerals (OCUVITE ADULT 50+ PO) Take by mouth daily.    [provider]  NOVOLIN 70/30 RELION (70-30) 100 UNIT/ML injection INJECT 15 UNITS SUBCUTANEOUSLY TWICE DAILY WITH MEALS 09/03/18   Cassandria Anger, MD  pravastatin (PRAVACHOL) 20 MG tablet Take 20 mg by mouth daily.    [provider]  terazosin (HYTRIN) 10 MG capsule Take 10 mg by mouth at bedtime.    [provider]  traZODone (DESYREL) 100 MG tablet Take 100 mg by mouth at bedtime.    [provider]    Family History No family history on file.  Social History Social History   Tobacco Use  . Smoking status: Former Smoker    Last attempt to quit: 08/16/2007    Years since quitting: 11.3  . Smokeless tobacco: Never Used  Substance Use Topics  . Alcohol use: Yes    Comment: Occassional  . Drug use: No     Allergies   Patient has no known allergies.   Review of Systems Review of Systems  Constitutional: Negative for fever.  HENT: Negative for sore throat.   Eyes: Negative for pain and visual disturbance.  Respiratory: Negative for shortness of breath.   Cardiovascular: Negative for chest pain.  Gastrointestinal: Negative for abdominal pain.  Genitourinary: Negative for dysuria.  Musculoskeletal: Positive for neck pain.  Skin: Positive for wound. Negative for rash.  Neurological: Negative for syncope and headaches.     Physical Exam Updated Vital Signs BP (!) 153/74 (BP Location: Right Arm)   Pulse 71   Temp 97.9 F (36.6 C) (Oral)   Resp 18   Ht 5\' 9"  (1.753 m)   Wt 115.2 kg   SpO2 98%   BMI 37.51 kg/m   Physical Exam Vitals signs and nursing note reviewed.  Constitutional:      Appearance: He is well-developed.  HENT:     Head: Normocephalic.     Comments: Is a very superficial laceration above his right eyebrow.  There is some  periorbital ecchymosis and he has a right subconjunctival hemorrhage. Eyes:     Extraocular Movements: Extraocular movements intact.     Conjunctiva/sclera: Conjunctivae normal.     Pupils: Pupils are equal, round, and reactive to light.  Neck:     Musculoskeletal: Neck supple. Muscular tenderness (Vague diffuse posterior) present. No neck rigidity.  Cardiovascular:     Rate and Rhythm: Normal rate and regular rhythm.     Heart sounds: No murmur.  Pulmonary:     Effort: Pulmonary effort is normal. No respiratory distress.     Breath sounds: Normal breath sounds.  Abdominal:     Palpations: Abdomen is soft.  Tenderness: There is no abdominal tenderness. There is no guarding.  Musculoskeletal: Normal range of motion.        General: Swelling, tenderness and signs of injury present.     Right lower leg: Edema present.     Left lower leg: Edema present.     Comments: He has full range of motion of his upper extremities.  He has some fullness in his right knee with some tenderness.  He can range that but it causes him some discomfort.  Left lower extremity full range of motion.  Skin:    General: Skin is warm and dry.     Capillary Refill: Capillary refill takes less than 2 seconds.  Neurological:     General: No focal deficit present.     Mental Status: He is alert and oriented to person, place, and time.     GCS: GCS eye subscore is 4. GCS verbal subscore is 5. GCS motor subscore is 6.     Sensory: No sensory deficit.     Motor: No weakness.      ED Treatments / Results  Labs (all labs ordered are listed, but only abnormal results are displayed) Labs Reviewed - No data to display  EKG None  Radiology Ct Head Wo Contrast  Result Date: 12/30/2018 CLINICAL DATA:  [place].  Hit head. EXAM: CT HEAD WITHOUT CONTRAST CT CERVICAL SPINE WITHOUT CONTRAST TECHNIQUE: Multidetector CT imaging of the head and cervical spine was performed following the standard protocol without intravenous  contrast. Multiplanar CT image reconstructions of the cervical spine were also generated. COMPARISON:  None. FINDINGS: CT HEAD FINDINGS Brain: The ventricles are in the midline without mass effect or shift. They are normal in size and configuration. No extra-axial fluid collections are identified. The gray-white differentiation is maintained. No CT findings for acute intracranial process such as hemispheric infarction or intracranial hemorrhage. No mass lesions. The brainstem and cerebellum are grossly normal. Vascular: Scattered vascular calcifications. No aneurysm or hyperdense vessels. Skull: No skull fracture. Sinuses/Orbits: Scattered frontal and ethmoid sinus disease. There is also mild mucoperiosteal thickening involving the right maxillary sinus. The mastoid air cells and middle ear cavities are clear. The globes are intact. Other: Small right frontal scalp contusion or hematoma. CT CERVICAL SPINE FINDINGS Alignment: Maintained. Skull base and vertebrae: The skull base C1 and C1-2 articulations are maintained. Moderate degenerative changes at C1-2. No acute cervical spine fracture. The facets are normally aligned. No facet or laminar fractures. Soft tissues and spinal canal: No prevertebral fluid or swelling. No visible canal hematoma. Disc levels: Multilevel degenerative cervical spondylosis with multilevel disc disease and facet disease. No obvious large disc protrusions. There are posterior spurring changes and probable remote calcified disc protrusions. There is multilevel spinal and foraminal stenosis most notable at C5-6. Upper chest: The lung apices are clear of an acute process. Small pulmonary nodules are noted. Other: None. IMPRESSION: 1. No acute intracranial findings or skull fracture. 2. A degenerative cervical spondylosis but no acute cervical spine fracture. 3. Left upper lobe pulmonary nodules are noted. Recommend full chest CT (non urgent). Electronically Signed   By: Marijo Sanes M.D.    On: 12/30/2018 19:04   Ct Cervical Spine Wo Contrast  Result Date: 12/30/2018 CLINICAL DATA:  [place].  Hit head. EXAM: CT HEAD WITHOUT CONTRAST CT CERVICAL SPINE WITHOUT CONTRAST TECHNIQUE: Multidetector CT imaging of the head and cervical spine was performed following the standard protocol without intravenous contrast. Multiplanar CT image reconstructions of the cervical spine  were also generated. COMPARISON:  None. FINDINGS: CT HEAD FINDINGS Brain: The ventricles are in the midline without mass effect or shift. They are normal in size and configuration. No extra-axial fluid collections are identified. The gray-white differentiation is maintained. No CT findings for acute intracranial process such as hemispheric infarction or intracranial hemorrhage. No mass lesions. The brainstem and cerebellum are grossly normal. Vascular: Scattered vascular calcifications. No aneurysm or hyperdense vessels. Skull: No skull fracture. Sinuses/Orbits: Scattered frontal and ethmoid sinus disease. There is also mild mucoperiosteal thickening involving the right maxillary sinus. The mastoid air cells and middle ear cavities are clear. The globes are intact. Other: Small right frontal scalp contusion or hematoma. CT CERVICAL SPINE FINDINGS Alignment: Maintained. Skull base and vertebrae: The skull base C1 and C1-2 articulations are maintained. Moderate degenerative changes at C1-2. No acute cervical spine fracture. The facets are normally aligned. No facet or laminar fractures. Soft tissues and spinal canal: No prevertebral fluid or swelling. No visible canal hematoma. Disc levels: Multilevel degenerative cervical spondylosis with multilevel disc disease and facet disease. No obvious large disc protrusions. There are posterior spurring changes and probable remote calcified disc protrusions. There is multilevel spinal and foraminal stenosis most notable at C5-6. Upper chest: The lung apices are clear of an acute process. Small  pulmonary nodules are noted. Other: None. IMPRESSION: 1. No acute intracranial findings or skull fracture. 2. A degenerative cervical spondylosis but no acute cervical spine fracture. 3. Left upper lobe pulmonary nodules are noted. Recommend full chest CT (non urgent). Electronically Signed   By: Marijo Sanes M.D.   On: 12/30/2018 19:04   Dg Knee Complete 4 Views Right  Result Date: 12/30/2018 CLINICAL DATA:  [place] and injured right knee. EXAM: RIGHT KNEE - COMPLETE 4+ VIEW COMPARISON:  None. FINDINGS: Moderate tricompartmental degenerative changes with joint space narrowing and spurring. No acute fracture or osteochondral lesion. Possible small joint effusion. 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 Electronically Signed   By: Marijo Sanes M.D.   On: 12/30/2018 18:40    Procedures .Marland KitchenLaceration Repair Date/Time: 12/30/2018 7:35 PM Performed by: Hayden Rasmussen, MD Authorized by: Hayden Rasmussen, MD   Consent:    Consent obtained:  Verbal   Consent given by:  Patient   Risks discussed:  Infection, pain, poor cosmetic result, poor wound healing and retained foreign body   Alternatives discussed:  No treatment and delayed treatment Anesthesia (see MAR for exact dosages):    Anesthesia method:  None Laceration details:    Location:  Face   Face location:  R eyebrow   Length (cm):  3 Repair type:    Repair type:  Simple Exploration:    Wound extent: no foreign bodies/material noted     Contaminated: no   Treatment:    Area cleansed with:  Saline Skin repair:    Repair method:  Tissue adhesive Post-procedure details:    Dressing:  Open (no dressing)   Patient tolerance of procedure:  Tolerated well, no immediate complications   (including critical care time)  Medications Ordered in ED Medications - No data to display   Initial Impression / Assessment and Plan / ED Course  I have reviewed the triage vital signs and the nursing  notes.  Pertinent labs & imaging results that were available during my care of the patient were reviewed by me and considered in my medical decision making (see chart for details).  Clinical Course as of Dec 31 2351  Mon Feb  54, 1151  [id number] 73 year old male on Eliquis with 2 falls one involving injuring his right knee and the other when he struck his head.  No loss of consciousness.  He is about a 3 cm stellate laceration above his right eye.  That was closed with Dermabond.  He is imaging of his head cervical spine and right knee were negative.  He feels just generally achy and sore in his head neck and right knee.  He has no chest pain or abdominal pain.  Will ambulate here to see if safe for discharge.   [MB]    Clinical Course User Index [MB] Hayden Rasmussen, MD       Final Clinical Impressions(s) / ED Diagnoses   Final diagnoses:  Injury of head, initial encounter  Contusion of face, initial encounter  Laceration of right periocular area without foreign body, initial encounter  Contusion of right knee, initial encounter  Fall, initial encounter    ED Discharge Orders    None       Hayden Rasmussen, MD 12/30/18 2353

## 2019-01-06 ENCOUNTER — Emergency Department (HOSPITAL_COMMUNITY): Payer: Medicare Other

## 2019-01-06 ENCOUNTER — Encounter (HOSPITAL_COMMUNITY): Payer: Self-pay

## 2019-01-06 ENCOUNTER — Emergency Department (HOSPITAL_COMMUNITY)
Admission: EM | Admit: 2019-01-06 | Discharge: 2019-01-06 | Disposition: A | Discharge status: Home / Self Care | Payer: Medicare Other | Attending: Emergency Medicine | Admitting: Emergency Medicine

## 2019-01-06 ENCOUNTER — Other Ambulatory Visit: Payer: Self-pay

## 2019-01-06 DIAGNOSIS — N183 Chronic kidney disease, stage 3 (moderate): Secondary | ICD-10-CM | POA: Insufficient documentation

## 2019-01-06 DIAGNOSIS — Z96659 Presence of unspecified artificial knee joint: Secondary | ICD-10-CM | POA: Insufficient documentation

## 2019-01-06 DIAGNOSIS — Z87891 Personal history of nicotine dependence: Secondary | ICD-10-CM | POA: Diagnosis not present

## 2019-01-06 DIAGNOSIS — Z7901 Long term (current) use of anticoagulants: Secondary | ICD-10-CM | POA: Diagnosis not present

## 2019-01-06 DIAGNOSIS — M79604 Pain in right leg: Secondary | ICD-10-CM | POA: Diagnosis not present

## 2019-01-06 DIAGNOSIS — E039 Hypothyroidism, unspecified: Secondary | ICD-10-CM | POA: Insufficient documentation

## 2019-01-06 DIAGNOSIS — Z7982 Long term (current) use of aspirin: Secondary | ICD-10-CM | POA: Insufficient documentation

## 2019-01-06 DIAGNOSIS — M7989 Other specified soft tissue disorders: Secondary | ICD-10-CM

## 2019-01-06 DIAGNOSIS — Z794 Long term (current) use of insulin: Secondary | ICD-10-CM | POA: Diagnosis not present

## 2019-01-06 DIAGNOSIS — E1122 Type 2 diabetes mellitus with diabetic chronic kidney disease: Secondary | ICD-10-CM | POA: Insufficient documentation

## 2019-01-06 DIAGNOSIS — Z79899 Other long term (current) drug therapy: Secondary | ICD-10-CM | POA: Insufficient documentation

## 2019-01-06 DIAGNOSIS — R2241 Localized swelling, mass and lump, right lower limb: Secondary | ICD-10-CM | POA: Diagnosis present

## 2019-01-06 DIAGNOSIS — I129 Hypertensive chronic kidney disease with stage 1 through stage 4 chronic kidney disease, or unspecified chronic kidney disease: Secondary | ICD-10-CM | POA: Insufficient documentation

## 2019-01-06 HISTORY — DX: Sleep apnea, unspecified: G47.30

## 2019-01-06 MED ORDER — TRAMADOL HCL 50 MG PO TABS: 50.0000 mg | ORAL_TABLET | Freq: Two times a day (BID) | ORAL | 0 refills | Status: DC | PRN

## 2019-01-06 NOTE — Discharge Instructions (Signed)
Elevate your right leg is much as possible.  Take the tramadol as directed.  Follow-up with your doctor in the next several days for recheck.

## 2019-01-06 NOTE — ED Triage Notes (Signed)
Pt reports fell through a deck last Monday. Reports was seen here initially.  PT says eye is better but r leg is swollen, red, and bruised.  Reports burning and stinging.

## 2019-01-06 NOTE — ED Provider Notes (Signed)
[facility] EMERGENCY DEPARTMENT Provider Note   CSN: [id number] Arrival date & time: 01/06/19  5462    History   Chief Complaint Chief Complaint  Patient presents with  . Leg Pain    HPI Bryce Mejia is a 73 y.o. male.     Patient with significant swelling to the right lower extremity following a fall through a deck on 23 December 2015.  Patient was evaluated here for injuries related to that.  Did include x-rays of his right knee.  Patient is on Eliquis.  Patient states that since the fall has had significant swelling to the right leg from the mid thigh down.  Lots of bruising.  Little bit of a yellow tone.  Leg feels tight.  No chest pain no shortness of breath the other injuries are healing.  Patient just concerned about the right leg.     Past Medical History:  Diagnosis Date  . Chronic kidney disease, stage 3 (HCC)    CKD stage 3  . Diabetes mellitus, type II ([place])   . Heart attack ([place])   . Sleep apnea     Patient Active Problem List   Diagnosis Date Noted  . Type 2 diabetes mellitus with stage 3 chronic kidney disease, with long-term current use of insulin ([place]) 01/17/2018  . DM type 2 causing vascular disease ([place]) 08/15/2017  . Type 2 diabetes mellitus with chronic kidney disease, with long-term current use of insulin ([place]) 08/15/2017  . Essential hypertension, benign 08/15/2017  . Mixed hyperlipidemia 08/15/2017  . Hypothyroidism 08/15/2017  . Morbid obesity ([place]) 08/15/2017    Past Surgical History:  Procedure Laterality Date  . CARPAL TUNNEL RELEASE    . NOSE SURGERY    . REPLACEMENT TOTAL KNEE          Home Medications    Prior to Admission medications   Medication Sig Start Date End Date Taking? Authorizing Provider  apixaban (ELIQUIS) 5 MG TABS tablet Take 5 mg by mouth 2 (two) times daily.    [provider]  aspirin EC 81 MG tablet Take 81 mg by mouth daily.    [provider]  cetirizine (ZYRTEC) 10 MG tablet Take 10  mg by mouth daily.    [provider]  Cholecalciferol (VITAMIN D) 2000 units CAPS Take by mouth daily.    [provider]  docusate sodium (DOCQLACE) 100 MG capsule Take 100 mg by mouth 2 (two) times daily.    [provider]  finasteride (PROSCAR) 5 MG tablet Take 5 mg by mouth daily.    [provider]  Flaxseed, Linseed, (FLAXSEED OIL PO) Take by mouth daily.    [provider]  fluticasone (FLOVENT DISKUS) 50 MCG/BLIST diskus inhaler Inhale 1 puff into the lungs 2 (two) times daily.    [provider]  furosemide (LASIX) 20 MG tablet Take 20 mg by mouth every other day.    [provider]  Garlic 7035 MG CAPS Take by mouth.    [provider]  HYDROcodone-acetaminophen (NORCO/VICODIN) 5-325 MG tablet Take 1 tablet by mouth every 6 (six) hours as needed for moderate pain.    [provider]  levothyroxine (SYNTHROID, LEVOTHROID) 175 MCG tablet Take 1 tablet (175 mcg total) by mouth daily before breakfast. 10/04/18   Nida, Marella Chimes, MD  lisinopril (PRINIVIL,ZESTRIL) 10 MG tablet Take 10 mg by mouth daily.    [provider]  Misc Natural Products (BLACK CHERRY CONCENTRATE PO) Take by mouth every  morning.    [provider]  Multiple Vitamins-Minerals (OCUVITE ADULT 50+ PO) Take by mouth daily.    [provider]  NOVOLIN 70/30 RELION (70-30) 100 UNIT/ML injection INJECT 15 UNITS SUBCUTANEOUSLY TWICE DAILY WITH MEALS 09/03/18   Cassandria Anger, MD  pravastatin (PRAVACHOL) 20 MG tablet Take 20 mg by mouth daily.    [provider]  terazosin (HYTRIN) 10 MG capsule Take 10 mg by mouth at bedtime.    [provider]  traMADol (ULTRAM) 50 MG tablet Take 1 tablet (50 mg total) by mouth every 12 (twelve) hours as needed. 01/06/19   Fredia Sorrow, MD  traZODone (DESYREL) 100 MG tablet Take 100 mg by mouth at bedtime.    [provider]    Family  History No family history on file.  Social History Social History   Tobacco Use  . Smoking status: Former Smoker    Last attempt to quit: 08/16/2007    Years since quitting: 11.4  . Smokeless tobacco: Never Used  Substance Use Topics  . Alcohol use: Yes    Comment: Occassional  . Drug use: No     Allergies   Patient has no known allergies.   Review of Systems Review of Systems  Constitutional: Negative for chills and fever.  HENT: Negative for rhinorrhea and sore throat.   Eyes: Negative for visual disturbance.  Respiratory: Negative for cough and shortness of breath.   Cardiovascular: Positive for leg swelling. Negative for chest pain.  Gastrointestinal: Negative for abdominal pain, diarrhea, nausea and vomiting.  Genitourinary: Negative for dysuria.  Musculoskeletal: Negative for back pain and neck pain.  Skin: Negative for rash.  Neurological: Negative for dizziness, light-headedness and headaches.  Hematological: Bruises/bleeds easily.  Psychiatric/Behavioral: Negative for confusion.     Physical Exam Updated Vital Signs BP (!) 152/67 (BP Location: Left Arm)   Pulse 72   Temp 97.7 F (36.5 C) (Oral)   Resp 18   Ht 1.753 m (5\' 9" )   Wt 115.2 kg   SpO2 97%   BMI 37.51 kg/m   Physical Exam Vitals signs and nursing note reviewed.  Constitutional:      Appearance: He is well-developed.  HENT:     Head: Normocephalic and atraumatic.  Eyes:     Extraocular Movements: Extraocular movements intact.     Conjunctiva/sclera: Conjunctivae normal.     Pupils: Pupils are equal, round, and reactive to light.  Neck:     Musculoskeletal: Neck supple.  Cardiovascular:     Rate and Rhythm: Normal rate and regular rhythm.     Heart sounds: No murmur.  Pulmonary:     Effort: Pulmonary effort is normal. No respiratory distress.     Breath sounds: Normal breath sounds.  Abdominal:     Palpations: Abdomen is soft.     Tenderness: There is no abdominal tenderness.   Musculoskeletal: Normal range of motion.        General: Swelling and tenderness present.     Right lower leg: Edema present.     Comments: Marked swelling to the right leg mid thigh down.  Lot of bruising.  No evidence of any cellulitis.  Distally dorsalis pedis pulses 1+ good cap refill.  Leg is tight.  Clearly has soft tissue swelling.  Left leg normal.  Skin:    General: Skin is warm and dry.     Capillary Refill: Capillary refill takes less than 2 seconds.  Neurological:     General: No focal  deficit present.     Mental Status: He is alert and oriented to person, place, and time.      ED Treatments / Results  Labs (all labs ordered are listed, but only abnormal results are displayed) Labs Reviewed - No data to display  EKG None  Radiology Dg Tibia/fibula Right  Result Date: 01/06/2019 CLINICAL DATA:  Fall 1 week ago.  Pain and swelling. EXAM: RIGHT TIBIA AND FIBULA - 2 VIEW COMPARISON:  None. FINDINGS: Mild degenerative changes are noted at the knee and ankle joints. No acute fractures are present. Vascular calcifications are evident. IMPRESSION: 1. No acute or healing fractures. 2. Mild degenerative change. 3. Atherosclerosis. Electronically Signed   By: San Morelle M.D.   On: 01/06/2019 08:26   Dg Ankle Complete Right  Result Date: 01/06/2019 CLINICAL DATA:  Fall 1 week ago.  Pain and swelling. EXAM: RIGHT ANKLE - COMPLETE 3+ VIEW COMPARISON:  None. FINDINGS: Soft tissue swelling is present over the lateral malleolus without an underlying fracture. A bone fragment at the medial malleolus likely reflects remote trauma. Vascular calcifications are present. Degenerative changes are noted at the calcaneus. IMPRESSION: 1. Soft tissue swelling over the lateral malleolus without underlying fracture. 2. No acute abnormality. Electronically Signed   By: San Morelle M.D.   On: 01/06/2019 08:27   US Venous Img Lower Right (dvt Study)  Result Date: 01/06/2019 CLINICAL  DATA:  Right lower extremity pain and edema. EXAM: RIGHT LOWER EXTREMITY VENOUS DOPPLER ULTRASOUND TECHNIQUE: Gray-scale sonography with graded compression, as well as color Doppler and duplex ultrasound were performed to evaluate the lower extremity deep venous systems from the level of the common femoral vein and including the common femoral, femoral, profunda femoral, popliteal and calf veins including the posterior tibial, peroneal and gastrocnemius veins when visible. The superficial great saphenous vein was also interrogated. Spectral Doppler was utilized to evaluate flow at rest and with distal augmentation maneuvers in the common femoral, femoral and popliteal veins. COMPARISON:  None. FINDINGS: Contralateral Common Femoral Vein: Respiratory phasicity is normal and symmetric with the symptomatic side. No evidence of thrombus. Normal compressibility. Common Femoral Vein: No evidence of thrombus. Normal compressibility, respiratory phasicity and response to augmentation. Saphenofemoral Junction: No evidence of thrombus. Normal compressibility and flow on color Doppler imaging. Profunda Femoral Vein: No evidence of thrombus. Normal compressibility and flow on color Doppler imaging. Femoral Vein: No evidence of thrombus. Normal compressibility, respiratory phasicity and response to augmentation. Popliteal Vein: No evidence of thrombus. Normal compressibility, respiratory phasicity and response to augmentation. Calf Veins: No evidence of thrombus. Normal compressibility and flow on color Doppler imaging. Superficial Great Saphenous Vein: No evidence of thrombus. Normal compressibility. Venous Reflux:  None. Other Findings: No evidence of superficial thrombophlebitis or abnormal fluid collection. IMPRESSION: No evidence of right lower extremity deep venous thrombosis. Electronically Signed   By: Aletta Edouard M.D.   On: 01/06/2019 09:02    Procedures Procedures (including critical care time)  Medications  Ordered in ED Medications - No data to display   Initial Impression / Assessment and Plan / ED Course  I have reviewed the triage vital signs and the nursing notes.  Pertinent labs & imaging results that were available during my care of the patient were reviewed by me and considered in my medical decision making (see chart for details).        Patient status post a fall where his right leg fell through a deck.  That was on February 17.  Patient had x-rays of his right knee at that time without any bony injury.  Patient also did have some head injury.  But that is all resolved.  And his concern today is the swelling to the right leg and a lot of bruising.  Patient is on Eliquis.  Patient is known to have renal insufficiency.  Reviewed his BUN and creatinine from November 2019.  X-rays here of tib-fib and ankle without any bony injuries.  Doppler studies without evidence of any DVT.  Feel that there is been soft tissue swelling and some bleeding into the skin no evidence of any compartment syndrome.  Distally is got palpable dorsalis pedis pulse and good cap refill to the toes.  Clinically not consistent with cellulitis just soft tissue swelling probably due to the trauma.  We will treat patient with a course of tramadol its been extended out every 12 hours due to his renal insufficiency.  Have patient follow-up with his primary care doctor in the [place] area.  Database was reviewed prior to prescribing the tramadol.  Patient will need to elevate the right leg as much as possible over the next 2 weeks. Final Clinical Impressions(s) / ED Diagnoses   Final diagnoses:  Right leg pain  Right leg swelling    ED Discharge Orders         Ordered    traMADol (ULTRAM) 50 MG tablet  Every 12 hours PRN     01/06/19 0942           Fredia Sorrow, MD 01/06/19 [phone]

## 2019-01-20 ENCOUNTER — Encounter (INDEPENDENT_AMBULATORY_CARE_PROVIDER_SITE_OTHER): Payer: Self-pay | Admitting: Physician Assistant

## 2019-01-20 ENCOUNTER — Ambulatory Visit (INDEPENDENT_AMBULATORY_CARE_PROVIDER_SITE_OTHER): Payer: Medicare Other | Admitting: Physician Assistant

## 2019-01-20 ENCOUNTER — Other Ambulatory Visit (INDEPENDENT_AMBULATORY_CARE_PROVIDER_SITE_OTHER): Payer: Self-pay

## 2019-01-20 DIAGNOSIS — Z792 Long term (current) use of antibiotics: Secondary | ICD-10-CM

## 2019-01-20 DIAGNOSIS — M1711 Unilateral primary osteoarthritis, right knee: Secondary | ICD-10-CM | POA: Diagnosis not present

## 2019-01-20 NOTE — Progress Notes (Signed)
Office Visit Note   Patient: Bryce Mejia           Date of Birth: 1946-07-19           MRN: [id number] Visit Date: 01/20/2019              Requested by: Roderic Scarce, MD [address] [place] [address] PCP: Roderic Scarce, MD   Assessment & Plan: Visit Diagnoses:  1. Unilateral primary osteoarthritis, right knee     Plan: He will work on elevation ice and heat to the knee.  Leg is wrapped out with an Ace bandage today he can use this or either compression stockings during the day to help with swelling.  Continue his clindamycin is prescribed by his primary care practitioner for the cellulitis.  We will see him back in 2 weeks to check his progress in regards to the knee.  Discussed with him at length that if he required any current surgery would recommend a total knee replacement and not a knee arthroscopy or an ACL reconstruction.  He understands this.  Questions encouraged and answered at length.  Right knee was prepped with Betadine today at ethyl chloride used and excised in the knee and lidocaine used 30 cc to further anesthetize knee.  20 cc of slightly blood-tinged yellow synovial fluid was aspirated patient tolerated well.  Follow-Up Instructions: Return in about 2 weeks (around 02/03/2019).   Orders:  No orders of the defined types were placed in this encounter.  No orders of the defined types were placed in this encounter.     Procedures: No procedures performed   Clinical Data: No additional findings.   Subjective: Chief Complaint  Patient presents with  . Right Knee - Pain    HPI Patient is a 73 year old male comes in today due to right knee pain.  He reports he fell through a deck on 12/30/2018 sustained an injury to his right knee and also a laceration above his right eye.  He was seen in the ER multiple x-rays and knee pain showed no acute fracture.  X-rays noted did show tricompartmental arthritis of the knee with moderate severe medial  compartmental changes and moderate patellofemoral changes mild lateral compartmental changes.  He also sustained a laceration to the right knee.  He is now developed cellulitis of the lower leg that is been treated with shot of Levaquin Floxin x2 in late February and now is on clindamycin 300 mg 1 every 8 hours.  He reports he had a Doppler performed to rule out a DVT and this was negative for DVT of the right lower leg.  He shows pictures of the significant bruising and down his leg starting up at the thigh and going all the way down below the calf.  Ecchymosis is resolving.  He is having swelling in the leg.  He is on Eliquis for history of coronary artery disease sounds as if he may have had A. fib when he was hospitalized back in September at that time was found to be severely anemic requiring several blood transfusions.  He reports his hemoglobin is now 11.  Also reports that work-up with colonoscopy and endoscopy and other imaging studies were done to rule out a GI bleed this was all negative.  Past medical history is otherwise pertinent for diabetes his last hemoglobin A1c is 6.8 reports good control.  Chronic kidney disease stage III.,  Hypothyroidism, essential hypertension, mixed hyperlipidemia and morbid obesity.  He  is recently had to deal also with the passing of his wife due to cancer. He states he had some pain in the right knee before the fall in February and some popping in the knee but this is only occasional.  No mechanical symptoms in the knee prior to the injury.  He is currently having no mechanical symptoms post injury either.  Has a history of a left knee replacement.  He describes a iatrogenic fracture of the left knee at the time of replacement requiring immobilizer for some time.  Then eventually had continued pain in the knee and was found to have loosening of the components and required a revision of the knee.  Currently left knee is doing well. MRI right knee dated 01/14/2019 showed  complete rupture of the ACL and a high-grade sprain versus tear of the posterior cruciate ligament.  Full-thickness cartilage loss medial compartment and partial thickness loss in the lateral compartment with partial thickness cartilage loss involving the patella and trochlear groove. Review of Systems  Denies any fevers chills shortness of breath or chest pain. Please see HPI otherwise negative  Objective: Vital Signs: There were no vitals taken for this visit.  Physical Exam Constitutional:      Appearance: He is obese. He is not ill-appearing or diaphoretic.  Pulmonary:     Effort: Pulmonary effort is normal.  Neurological:     Mental Status: He is alert and oriented to person, place, and time.     Ortho Exam Right knee tenderness along the medial joint line.  Area of edema due to suspected hematoma.  Significant edema down the leg with pitting no pitting of the left leg no edema left leg.  Right calf supple nontender.  Positive effusion right knee.  Anterior drawer is equivocal.  No instability valgus varus stressing.  Full extension knee and flexion beyond 90 degrees. Specialty Comments:  No specialty comments available.  Imaging: No results found.   PMFS History: Patient Active Problem List   Diagnosis Date Noted  . Type 2 diabetes mellitus with stage 3 chronic kidney disease, with long-term current use of insulin ([place]) 01/17/2018  . DM type 2 causing vascular disease ([place]) 08/15/2017  . Type 2 diabetes mellitus with chronic kidney disease, with long-term current use of insulin ([place]) 08/15/2017  . Essential hypertension, benign 08/15/2017  . Mixed hyperlipidemia 08/15/2017  . Hypothyroidism 08/15/2017  . Morbid obesity ([place]) 08/15/2017   Past Medical History:  Diagnosis Date  . Chronic kidney disease, stage 3 (HCC)    CKD stage 3  . Diabetes mellitus, type II ([place])   . Heart attack ([place])   . Sleep apnea     History reviewed. No pertinent family history.  Past  Surgical History:  Procedure Laterality Date  . CARPAL TUNNEL RELEASE    . NOSE SURGERY    . REPLACEMENT TOTAL KNEE     Social History   Occupational History  . Not on file  Tobacco Use  . Smoking status: Former Smoker    Last attempt to quit: 08/16/2007    Years since quitting: 11.4  . Smokeless tobacco: Never Used  Substance and Sexual Activity  . Alcohol use: Yes    Comment: Occassional  . Drug use: No  . Sexual activity: Not on file

## 2019-01-29 LAB — COMPLETE METABOLIC PANEL WITH GFR
AG Ratio: 1.5 (calc) (ref 1.0–2.5)
ALT: 15 U/L (ref 9–46)
AST: 23 U/L (ref 10–35)
Albumin: 3.6 g/dL (ref 3.6–5.1)
Alkaline phosphatase (APISO): 61 U/L (ref 35–144)
BUN/Creatinine Ratio: 17 (calc) (ref 6–22)
BUN: 34 mg/dL — AB (ref 7–25)
CALCIUM: 9.1 mg/dL (ref 8.6–10.3)
CO2: 25 mmol/L (ref 20–32)
CREATININE: 2.02 mg/dL — AB (ref 0.70–1.18)
Chloride: 108 mmol/L (ref 98–110)
GFR, EST AFRICAN AMERICAN: 37 mL/min/{1.73_m2} — AB (ref >=60)
GFR, EST NON AFRICAN AMERICAN: 32 mL/min/{1.73_m2} — AB (ref >=60)
Globulin: 2.4 g/dL (calc) (ref 1.9–3.7)
Glucose, Bld: 144 mg/dL — ABNORMAL HIGH (ref 65–99)
Potassium: 4.9 mmol/L (ref 3.5–5.3)
Sodium: 140 mmol/L (ref 135–146)
TOTAL PROTEIN: 6 g/dL — AB (ref 6.1–8.1)
Total Bilirubin: 0.3 mg/dL (ref 0.2–1.2)

## 2019-01-29 LAB — HEMOGLOBIN A1C
EAG (MMOL/L): 7.9 (calc)
HEMOGLOBIN A1C: 6.6 %{Hb} — AB (ref <5.7)
MEAN PLASMA GLUCOSE: 143 (calc)

## 2019-01-29 LAB — MICROALBUMIN / CREATININE URINE RATIO
Creatinine, Urine: 116 mg/dL (ref 20–320)
MICROALB/CREAT RATIO: 1429 ug/mg{creat} — AB (ref <30)
Microalb, Ur: 165.8 mg/dL

## 2019-01-29 LAB — TSH: TSH: 2.88 mIU/L (ref 0.40–4.50)

## 2019-01-29 LAB — VITAMIN D 25 HYDROXY (VIT D DEFICIENCY, FRACTURES): Vit D, 25-Hydroxy: 44 ng/mL (ref 30–100)

## 2019-01-29 LAB — T4, FREE: FREE T4: 1 ng/dL (ref 0.8–1.8)

## 2019-01-31 ENCOUNTER — Telehealth (INDEPENDENT_AMBULATORY_CARE_PROVIDER_SITE_OTHER): Payer: Self-pay

## 2019-01-31 NOTE — Telephone Encounter (Signed)
Called patient and asked the screening questions.  Do you have now or have you had in the past 7 days a fever and/or chills? NO  Do you have now or have you had in the past 7 days a cough? NO  Do you have now or have you had in the last 7 days nausea, vomiting or abdominal pain? NO  Have you been exposed to anyone who has tested positive for COVID-19? NO  Have you or anyone who lives with you traveled within the last month? NO

## 2019-02-03 ENCOUNTER — Other Ambulatory Visit (INDEPENDENT_AMBULATORY_CARE_PROVIDER_SITE_OTHER): Payer: Self-pay

## 2019-02-03 ENCOUNTER — Other Ambulatory Visit: Payer: Self-pay

## 2019-02-03 ENCOUNTER — Ambulatory Visit (INDEPENDENT_AMBULATORY_CARE_PROVIDER_SITE_OTHER): Payer: Medicare Other | Admitting: Orthopaedic Surgery

## 2019-02-03 ENCOUNTER — Encounter (INDEPENDENT_AMBULATORY_CARE_PROVIDER_SITE_OTHER): Payer: Self-pay | Admitting: Orthopaedic Surgery

## 2019-02-03 DIAGNOSIS — R21 Rash and other nonspecific skin eruption: Secondary | ICD-10-CM

## 2019-02-03 DIAGNOSIS — L509 Urticaria, unspecified: Secondary | ICD-10-CM | POA: Diagnosis not present

## 2019-02-03 DIAGNOSIS — M1711 Unilateral primary osteoarthritis, right knee: Secondary | ICD-10-CM | POA: Diagnosis not present

## 2019-02-03 MED ORDER — MONTELUKAST SODIUM 10 MG PO TABS: 10.0000 mg | ORAL_TABLET | Freq: Every day | ORAL | 0 refills | Status: DC

## 2019-02-03 NOTE — Progress Notes (Signed)
HPI: [name] returns today for follow-up of his right knee.  He states that his knee pain is somewhat better but still painful at times.  He unfortunately is breaking out in a rash involving his whole body except for the head palms and feet.  He notes that a few days ago his face got red and he had some itchiness in his years but this is subsided.  He is taking Benadryl and Zyrtec.  He was given some Decadron.  He states that the rash is keeping awake at night.  He is tried soaks and Epson salt.  He was on clindamycin due to some cellulitis involving his right lower leg however he is not been on the clindamycin for the last 2 weeks.  He denies any fevers chills.  Does have the stress of his wife passing away recently.  Review of systems see HPI otherwise negative  Physical exam: General: Well-developed well-nourished male in no acute distress. Psych: Alert and oriented x3 Skin: Hives are located on the chest abdomen and back arms bilateral legs.  Spares the palms of the hands and feet.  Face with no obvious hives.  Hives in multiple areas become patches.  There is no signs of gross infection.  Appears he has hives on top of cellulitis involving the right lower leg.  The hematoma of the medial aspect of the right knee is resolving.  Impression: Right knee osteoarthritis involving the medial lateral patellofemoral compartments. Hives  Plan: Had Dr. Junius Roads will get her today in the office we discussed with patient any detergent changes medicine changes never been.  Dr. Junius Roads recommended to place patient on Singulair 10 mg once daily.  Needs to stop all of his vitamins except for his dried cherries.  We will try to arrange an appointment  with a dermatologist for him.  In regards to his right knee he needs a right total knee arthroplasty however due to the current pandemic no elective surgeries can be scheduled.  Therefore we will see him back in 6 weeks to see what the status of elective surgeries is at  that time.

## 2019-02-07 ENCOUNTER — Ambulatory Visit (INDEPENDENT_AMBULATORY_CARE_PROVIDER_SITE_OTHER): Payer: Medicare Other | Admitting: "Endocrinology

## 2019-02-07 ENCOUNTER — Encounter: Payer: Self-pay | Admitting: "Endocrinology

## 2019-02-07 DIAGNOSIS — E1159 Type 2 diabetes mellitus with other circulatory complications: Secondary | ICD-10-CM | POA: Diagnosis not present

## 2019-02-07 DIAGNOSIS — E1122 Type 2 diabetes mellitus with diabetic chronic kidney disease: Secondary | ICD-10-CM

## 2019-02-07 DIAGNOSIS — E039 Hypothyroidism, unspecified: Secondary | ICD-10-CM | POA: Diagnosis not present

## 2019-02-07 DIAGNOSIS — N183 Chronic kidney disease, stage 3 (moderate): Secondary | ICD-10-CM | POA: Diagnosis not present

## 2019-02-07 DIAGNOSIS — Z794 Long term (current) use of insulin: Secondary | ICD-10-CM

## 2019-02-07 NOTE — Progress Notes (Signed)
Endocrinology Telephone Visit Follow up Note -During COVID -19 Pandemic   Subjective:    Patient ID: Bryce Mejia, male    DOB: January 30, 1946.  he is engaged in telephone visits for management of currently uncontrolled symptomatic type 2 diabetes, hypothyroidism, hyperlipidemia.   PMD:  Roderic Scarce, MD.   Past Medical History:  Diagnosis Date  . Chronic kidney disease, stage 3 (HCC)    CKD stage 3  . Diabetes mellitus, type II ([place])   . Heart attack ([place])   . Sleep apnea    Past Surgical History:  Procedure Laterality Date  . CARPAL TUNNEL RELEASE    . NOSE SURGERY    . REPLACEMENT TOTAL KNEE     Social History   Socioeconomic History  . Marital status: Married    Spouse name: Not on file  . Number of children: Not on file  . Years of education: Not on file  . Highest education level: Not on file  Occupational History  . Not on file  Social Needs  . Financial resource strain: Not on file  . Food insecurity:    Worry: Not on file    Inability: Not on file  . Transportation needs:    Medical: Not on file    Non-medical: Not on file  Tobacco Use  . Smoking status: Former Smoker    Last attempt to quit: 08/16/2007    Years since quitting: 11.4  . Smokeless tobacco: Never Used  Substance and Sexual Activity  . Alcohol use: Yes    Comment: Occassional  . Drug use: No  . Sexual activity: Not on file  Lifestyle  . Physical activity:    Days per week: Not on file    Minutes per session: Not on file  . Stress: Not on file  Relationships  . Social connections:    Talks on phone: Not on file    Gets together: Not on file    Attends religious service: Not on file    Active member of club or organization: Not on file    Attends meetings of clubs or organizations: Not on file    Relationship status: Not on file  Other Topics Concern  . Not on file  Social History Narrative  . Not on file   Outpatient  Encounter Medications as of 02/07/2019  Medication Sig  . amoxicillin (AMOXIL) 875 MG tablet amoxicillin 875 mg tablet  . apixaban (ELIQUIS) 5 MG TABS tablet Take 5 mg by mouth 2 (two) times daily.  . Asenapine Maleate (SAPHRIS SL) Saphris (black cherry)  500mg  daily  . aspirin EC 81 MG tablet Take 81 mg by mouth daily.  . cephALEXin (KEFLEX) 500 MG capsule cephalexin 500 mg capsule  . cetirizine (ZYRTEC) 10 MG tablet Take 10 mg by mouth daily.  . Cholecalciferol (VITAMIN D) 2000 units CAPS Take by mouth daily.  . clindamycin (CLEOCIN) 300 MG capsule TAKE 1 CAPSULE BY MOUTH EVERY 8 HOURS FOR 10 DAYS  . clonazePAM (KLONOPIN) 0.5 MG tablet TAKE 1 2 TO 1 (ONE HALF TO ONE) TABLET BY MOUTH TWICE DAILY AS NEEDED FOR ANXIETY  . clopidogrel (PLAVIX) 75 MG tablet clopidogrel 75 mg tablet  .  diazepam (VALIUM) 5 MG tablet diazepam 5 mg tablet  . diclofenac (CATAFLAM) 50 MG tablet diclofenac potassium 50 mg tablet  . dicyclomine (BENTYL) 10 MG capsule dicyclomine 10 mg capsule  . docusate sodium (DOCQLACE) 100 MG capsule Take 100 mg by mouth 2 (two) times daily.  . finasteride (PROSCAR) 5 MG tablet Take 5 mg by mouth daily.  . Flaxseed, Linseed, (FLAXSEED OIL PO) Take by mouth daily.  . fluticasone (FLONASE) 50 MCG/ACT nasal spray fluticasone propionate 50 mcg/actuation nasal spray,suspension  . fluticasone (FLOVENT DISKUS) 50 MCG/BLIST diskus inhaler Inhale 1 puff into the lungs 2 (two) times daily.  . furosemide (LASIX) 20 MG tablet Take 20 mg by mouth every other day.  . Garlic 10 MG CAPS garlic  . Garlic 5462 MG CAPS Take by mouth.  Marland Kitchen glipiZIDE (GLUCOTROL) 10 MG tablet glipizide 10 mg tablet  . glucose blood test strip ReliOn Prime Test Strips  1 three times daily  . HYDROcodone-acetaminophen (NORCO/VICODIN) 5-325 MG tablet Take 1 tablet by mouth every 6 (six) hours as needed for moderate pain.  Marland Kitchen insulin aspart (NOVOLOG FLEXPEN) 100 UNIT/ML FlexPen Novolog Flexpen U-100 Insulin aspart 100 unit/mL  (3 mL) subcutaneous  . levofloxacin (LEVAQUIN) 500 MG tablet Take 500 mg by mouth daily.  Marland Kitchen levothyroxine (SYNTHROID, LEVOTHROID) 175 MCG tablet Take 1 tablet (175 mcg total) by mouth daily before breakfast.  . lisinopril (PRINIVIL,ZESTRIL) 10 MG tablet Take 10 mg by mouth daily.  Marland Kitchen LORazepam (ATIVAN) 2 MG tablet TAKE 1 TABLET 30 MINUTES PRIOR TO PROCEDURE DO NOT DRIVE FOR 24 HOURS AFTER TAKING THIS MEDICATION  . Melatonin 5 MG TABS Take by mouth.  . metoprolol tartrate (LOPRESSOR) 25 MG tablet metoprolol tartrate 25 mg tablet  . metroNIDAZOLE (FLAGYL) 250 MG tablet metronidazole 250 mg tablet  . Misc Natural Products (BLACK CHERRY CONCENTRATE PO) Take by mouth every morning.  . montelukast (SINGULAIR) 10 MG tablet Take 1 tablet (10 mg total) by mouth at bedtime.  . Multiple Vitamins-Minerals (OCUVITE ADULT 50+ PO) Take by mouth daily.  . Multiple Vitamins-Minerals (OCUVITE ADULT 50+ PO) Ocuvite  1 daily  . nitroGLYCERIN (NITROSTAT) 0.4 MG SL tablet Place under the tongue.  Marland Kitchen NOVOLIN 70/30 RELION (70-30) 100 UNIT/ML injection INJECT 15 UNITS SUBCUTANEOUSLY TWICE DAILY WITH MEALS  . pantoprazole (PROTONIX) 40 MG tablet   . polyethylene glycol (GAVILYTE-G) 236 g solution GaviLyte-G 236 gram-22.74 gram-6.74 gram-5.86 gram oral solution  . pravastatin (PRAVACHOL) 20 MG tablet Take 20 mg by mouth daily.  Marland Kitchen sulfamethoxazole-trimethoprim (BACTRIM DS,SEPTRA DS) 800-160 MG tablet Take 1 tablet by mouth 2 (two) times daily.  Marland Kitchen terazosin (HYTRIN) 10 MG capsule Take 10 mg by mouth at bedtime.  . traMADol (ULTRAM) 50 MG tablet Take 1 tablet (50 mg total) by mouth every 12 (twelve) hours as needed.  . traZODone (DESYREL) 100 MG tablet Take 100 mg by mouth at bedtime.  . triamcinolone cream (KENALOG) 0.1 % triamcinolone acetonide 0.1 % topical cream   No facility-administered encounter medications on file as of 02/07/2019.     ALLERGIES: No Known Allergies  VACCINATION STATUS:  There is no  immunization history on file for this patient.  Diabetes  He presents for his follow-up diabetic visit. He has type 2 diabetes mellitus. Onset time: He was diagnosed at approximate age of 32 years. His disease course has been stable. There are no hypoglycemic associated symptoms. Pertinent negatives for hypoglycemia include no confusion, headaches, pallor or seizures. Pertinent negatives for diabetes include no chest pain,  no fatigue, no polydipsia, no polyphagia, no polyuria and no weakness. There are no hypoglycemic complications. Symptoms are stable. Diabetic complications include heart disease and nephropathy. Risk factors for coronary artery disease include dyslipidemia, diabetes mellitus, hypertension, male sex, obesity, sedentary lifestyle and tobacco exposure. Current diabetic treatment includes insulin injections and oral agent (monotherapy) (He is currently on Novolin N 20 units twice a day, NovoLog 3-12 units 3 times a day before meals, glipizide 10 mg by mouth twice a day.). His weight is decreasing steadily. He is following a generally unhealthy diet. When asked about meal planning, he reported none. He has not had a previous visit with a dietitian. He never participates in exercise. His breakfast blood glucose range is generally 130-140 mg/dl. His bedtime blood glucose range is generally 130-140 mg/dl. His overall blood glucose range is 130-140 mg/dl. An ACE inhibitor/angiotensin II receptor blocker is being taken.  Hyperlipidemia  This is a chronic problem. The current episode started more than 1 year ago. The problem is uncontrolled. Exacerbating diseases include diabetes, hypothyroidism and obesity. Pertinent negatives include no chest pain, myalgias or shortness of breath. Current antihyperlipidemic treatment includes statins. Risk factors for coronary artery disease include diabetes mellitus, dyslipidemia, hypertension, male sex, obesity and a sedentary lifestyle.  Hypertension  This is a  chronic problem. The current episode started more than 1 year ago. Pertinent negatives include no chest pain, headaches, neck pain, palpitations or shortness of breath. Risk factors for coronary artery disease include dyslipidemia, diabetes mellitus, male gender, obesity, sedentary lifestyle and smoking/tobacco exposure. Hypertensive end-organ damage includes kidney disease, CAD/MI and heart failure.      Objective:    There were no vitals taken for this visit.  Wt Readings from Last 3 Encounters:  01/06/19 254 lb (115.2 kg)  12/30/18 254 lb (115.2 kg)  10/04/18 260 lb (117.9 kg)       Recent Results (from the past 2160 hour(s))  Hemoglobin A1c     Status: Abnormal   Collection Time: 01/28/19 10:05 AM  Result Value Ref Range   Hgb A1c MFr Bld 6.6 (H) <5.7 % of total Hgb    Comment: For someone without known diabetes, a hemoglobin A1c value of 6.5% or greater indicates that they may have  diabetes and this should be confirmed with a follow-up  test. . For someone with known diabetes, a value <7% indicates  that their diabetes is well controlled and a value  greater than or equal to 7% indicates suboptimal  control. A1c targets should be individualized based on  duration of diabetes, age, comorbid conditions, and  other considerations. . Currently, no consensus exists regarding use of hemoglobin A1c for diagnosis of diabetes for children. .    Mean Plasma Glucose 143 (calc)   eAG (mmol/L) 7.9 (calc)  COMPLETE METABOLIC PANEL WITH GFR     Status: Abnormal   Collection Time: 01/28/19 10:05 AM  Result Value Ref Range   Glucose, Bld 144 (H) 65 - 99 mg/dL    Comment: .            Fasting reference interval . For someone without known diabetes, a glucose value >125 mg/dL indicates that they may have diabetes and this should be confirmed with a follow-up test. .    BUN 34 (H) 7 - 25 mg/dL   Creat 2.02 (H) 0.70 - 1.18 mg/dL    Comment: For patients >73 years of age, the  reference limit for Creatinine is approximately 13% higher for people identified  as African-American. .    GFR, Est Non African American 32 (L) > OR = 60 mL/min/1.78m2   GFR, Est African American 37 (L) > OR = 60 mL/min/1.4m2   BUN/Creatinine Ratio 17 6 - 22 (calc)   Sodium 140 135 - 146 mmol/L   Potassium 4.9 3.5 - 5.3 mmol/L   Chloride 108 98 - 110 mmol/L   CO2 25 20 - 32 mmol/L   Calcium 9.1 8.6 - 10.3 mg/dL   Total Protein 6.0 (L) 6.1 - 8.1 g/dL   Albumin 3.6 3.6 - 5.1 g/dL   Globulin 2.4 1.9 - 3.7 g/dL (calc)   AG Ratio 1.5 1.0 - 2.5 (calc)   Total Bilirubin 0.3 0.2 - 1.2 mg/dL   Alkaline phosphatase (APISO) 61 35 - 144 U/L   AST 23 10 - 35 U/L   ALT 15 9 - 46 U/L  TSH     Status: None   Collection Time: 01/28/19 10:05 AM  Result Value Ref Range   TSH 2.88 0.40 - 4.50 mIU/L  T4, free     Status: None   Collection Time: 01/28/19 10:05 AM  Result Value Ref Range   Free T4 1.0 0.8 - 1.8 ng/dL  Microalbumin / creatinine urine ratio     Status: Abnormal   Collection Time: 01/28/19 10:05 AM  Result Value Ref Range   Creatinine, Urine 116 20 - 320 mg/dL   Microalb, Ur 165.8 mg/dL    Comment: Verified by repeat analysis. Marland Kitchen Reference Range Not established    Microalb Creat Ratio 1,429 (H) <30 mcg/mg creat    Comment: . The ADA defines abnormalities in albumin excretion as follows: Marland Kitchen Category         Result (mcg/mg creatinine) . Normal                    <30 Microalbuminuria         30-299  Clinical albuminuria   > OR = 300 . The ADA recommends that at least two of three specimens collected within a 3-6 month period be abnormal before considering a patient to be within a diagnostic category.   VITAMIN D 25 Hydroxy (Vit-D Deficiency, Fractures)     Status: None   Collection Time: 01/28/19 10:05 AM  Result Value Ref Range   Vit D, 25-Hydroxy 44 30 - 100 ng/mL    Comment: Vitamin D Status         25-OH Vitamin D: . Deficiency:                    <20  ng/mL Insufficiency:             20 - 29 ng/mL Optimal:                 > or = 30 ng/mL . For 25-OH Vitamin D testing on patients on  D2-supplementation and patients for whom quantitation  of D2 and D3 fractions is required, the QuestAssureD(TM) 25-OH VIT D, (D2,D3), LC/MS/MS is recommended: order  code ([phone] (patients >37yrs). . For more information on this test, go to: http://education.questdiagnostics.com/faq/FAQ163 (This link is being provided for  informational/educational purposes only.)      Assessment & Plan:   1. DM type 2 causing vascular disease ([place]) - Patient has currently uncontrolled symptomatic type 2 DM since  73 years of age. -He reports that his glycemic profile is near target, A1c of 6.6% generally improving from 9.5%.    -his diabetes  is complicated by coronary artery disease, CKD, obesity/sedentary life and Kees Idrovo remains at a high risk for more acute and chronic complications which include CAD, CVA, CKD, retinopathy, and neuropathy. These are all discussed in detail with the patient.  - Patient admits there is a room for improvement in his diet and drink choices. -  Suggestion is made for him to avoid simple carbohydrates  from his diet including Cakes, Sweet Desserts / Pastries, Ice Cream, Soda (diet and regular), Sweet Tea, Candies, Chips, Cookies, Store Bought Juices, Alcohol in Excess of  1-2 drinks a day, Artificial Sweeteners, and "Sugar-free" Products. This will help patient to have stable blood glucose profile and potentially avoid unintended weight gain.   -He has done very well on premixed insulin Novolin 70/30.  He is advised to continue  Novolin  70/30, 15 units with breakfast and 15 units with supper for pre-meal blood glucose readings above 90 mg/dL, associated with strict monitoring of blood glucose 2 times a day-before breakfast and before supper.  - Patient is warned not to take insulin without proper monitoring per orders.  -Patient  is encouraged to call clinic for blood glucose levels less than 70 or above 300 mg /dl.  -Patient is not a candidate for  SGLT2 inhibitors due to CKD.  He is advised to discontinue naproxen he is taking for pain which may worsen his renal function. - he will be considered for incretin therapy as appropriate next visit.   3) Lipids/HPL: His most recent lipid panel showed controlled LDL at 48 improving from 90.  He is advised to continue pravastatin 20 mg p.o. nightly.     3) hypothyroidism: - His previsit thyroid function tests are consistent with appropriate replacement.  He is advised to continue levothyroxine 175 mcg p.o. every morning.    - We discussed about the correct intake of his thyroid hormone, on empty stomach at fasting, with water, separated by at least 30 minutes from breakfast and other medications,  and separated by more than 4 hours from calcium, iron, multivitamins, acid reflux medications (PPIs). -Patient is made aware of the fact that thyroid hormone replacement is needed for life, dose to be adjusted by periodic monitoring of thyroid function tests.  - I advised patient to maintain close follow up with Roderic Scarce, MD for primary care needs.  - Patient Care Time Today:  25 min, of which >50% was spent in reviewing his  current and  previous labs/studies, blood glucose readings, previous treatments, and medications doses and developing a plan for long-term care based on the latest recommendations for standards of care.  Bryce Mejia participated in the discussions, expressed understanding, and voiced agreement with the above plans.  All questions were answered to his satisfaction. he is encouraged to contact clinic should he have any questions or concerns prior to his return visit.   Follow up plan: - Return in about 4 months (around 06/09/2019) for Follow up with Pre-visit Labs, Meter, and Logs.  Glade Lloyd, MD Phone: [phone]  Fax: [phone]   02/07/2019,  10:24 AM This note was partially dictated with voice recognition software. Similar sounding words can be transcribed inadequately or may not  be corrected upon review.

## 2019-03-07 ENCOUNTER — Other Ambulatory Visit: Payer: Self-pay | Admitting: "Endocrinology

## 2019-03-17 ENCOUNTER — Ambulatory Visit (INDEPENDENT_AMBULATORY_CARE_PROVIDER_SITE_OTHER): Payer: Medicare Other | Admitting: Orthopaedic Surgery

## 2019-03-17 ENCOUNTER — Encounter: Payer: Self-pay | Admitting: Orthopaedic Surgery

## 2019-03-17 ENCOUNTER — Other Ambulatory Visit: Payer: Self-pay

## 2019-03-17 DIAGNOSIS — M1711 Unilateral primary osteoarthritis, right knee: Secondary | ICD-10-CM | POA: Diagnosis not present

## 2019-03-17 NOTE — Progress Notes (Signed)
Office Visit Note   Patient: Bryce Mejia           Date of Birth: November 29, 1945           MRN: [id number] Visit Date: 03/17/2019              Requested by: Roderic Scarce, MD [address] [place] [address] PCP: Roderic Scarce, MD   Assessment & Plan: Visit Diagnoses:  1. Unilateral primary osteoarthritis, right knee     Plan: I agree with his desire to have a knee replacement based on his pain, the detrimental effects of his pain is having his mobility and his quality of life and given the fact that his x-rays show severe arthritis in his right knee.  He is tried and failed other conservative treatment measures.  We went over knee replacement with him in detail talk about the risk and benefits of surgery.  He is well aware of this having had this done on his left knee 9 years ago.  He does wish to proceed sometime in the future once the COVID 19 pandemic has subsided to the point that they are allowing total joint replacement surgery.  Given the fact that he lives alone he would like to have several days of rehab at a skilled nursing facility near where he lives in [place].  We will be in touch once we are allowed to get surgery scheduled.  All question concerns were answered and addressed.  Follow-Up Instructions: Return for 2 weeks post-op.   Orders:  No orders of the defined types were placed in this encounter.  No orders of the defined types were placed in this encounter.     Procedures: No procedures performed   Clinical Data: No additional findings.   Subjective: Chief Complaint  Patient presents with  . Right Knee - Follow-up  The patient comes in for continued follow-up and treatment of end-stage arthritis involving the right knee.  He is about 9 years out from a left total knee arthroplasty.  His right knee hurts on a daily basis and at this point it is detrimental effect his mobility, his quality of life and his activities daily living.  He  is very active 73 year old old.  He is a widower.  He did have an arthroscopic intervention of his right knee remotely.  He is a diabetic but has a hemoglobin A1c below 7.  Steroid injections do adversely affect his blood glucose.  At this point he has been dealing with his knee for well over a year now.  He is worked on activity modification and weight loss and other conservative treatment measures.  He is at the point he does wish to proceed with total knee arthroplasty.  His pain is adversely affecting and detrimentally affecting his actives daily living, his quality of life and his mobility.  HPI  Review of Systems He currently denies any headache, chest pain, shortness of breath, fever, chills, nausea, vomiting.  Objective: Vital Signs: There were no vitals taken for this visit.  Physical Exam He is alert and oriented x3 and in no acute distress Ortho Exam Examination of his right knee shows varus malalignment and significant medial joint line tenderness.  There is patellofemoral crepitation as well.  He is got good range of motion overall. Specialty Comments:  No specialty comments available.  Imaging: No results found. Previous x-rays of his right knee shows severe tricompartmental arthritic changes with medial joint space narrowing, patellofemoral  arthritic changes and osteophytes in all 3 compartments.  PMFS History: Patient Active Problem List   Diagnosis Date Noted  . Unilateral primary osteoarthritis, right knee 03/17/2019  . Type 2 diabetes mellitus with stage 3 chronic kidney disease, with long-term current use of insulin ([place]) 01/17/2018  . DM type 2 causing vascular disease ([place]) 08/15/2017  . Type 2 diabetes mellitus with chronic kidney disease, with long-term current use of insulin ([place]) 08/15/2017  . Essential hypertension, benign 08/15/2017  . Mixed hyperlipidemia 08/15/2017  . Hypothyroidism 08/15/2017  . Morbid obesity ([place]) 08/15/2017   Past Medical History:   Diagnosis Date  . Chronic kidney disease, stage 3 (HCC)    CKD stage 3  . Diabetes mellitus, type II ([place])   . Heart attack ([place])   . Sleep apnea     No family history on file.  Past Surgical History:  Procedure Laterality Date  . CARPAL TUNNEL RELEASE    . NOSE SURGERY    . REPLACEMENT TOTAL KNEE     Social History   Occupational History  . Not on file  Tobacco Use  . Smoking status: Former Smoker    Last attempt to quit: 08/16/2007    Years since quitting: 11.5  . Smokeless tobacco: Never Used  Substance and Sexual Activity  . Alcohol use: Yes    Comment: Occassional  . Drug use: No  . Sexual activity: Not on file

## 2019-04-13 ENCOUNTER — Other Ambulatory Visit: Payer: Self-pay | Admitting: "Endocrinology

## 2019-06-03 LAB — COMPLETE METABOLIC PANEL WITH GFR
AG Ratio: 1.6 (calc) (ref 1.0–2.5)
ALT: 16 U/L (ref 9–46)
AST: 20 U/L (ref 10–35)
Albumin: 3.8 g/dL (ref 3.6–5.1)
Alkaline phosphatase (APISO): 54 U/L (ref 35–144)
BUN/Creatinine Ratio: 16 (calc) (ref 6–22)
BUN: 39 mg/dL — ABNORMAL HIGH (ref 7–25)
CALCIUM: 8.9 mg/dL (ref 8.6–10.3)
CO2: 25 mmol/L (ref 20–32)
Chloride: 112 mmol/L — ABNORMAL HIGH (ref 98–110)
Creat: 2.4 mg/dL — ABNORMAL HIGH (ref 0.70–1.18)
GFR, Est African American: 30 mL/min/{1.73_m2} — ABNORMAL LOW (ref >=60)
GFR, Est Non African American: 26 mL/min/{1.73_m2} — ABNORMAL LOW (ref >=60)
Globulin: 2.4 g/dL (calc) (ref 1.9–3.7)
Glucose, Bld: 141 mg/dL — ABNORMAL HIGH (ref 65–139)
POTASSIUM: 5.3 mmol/L (ref 3.5–5.3)
Sodium: 142 mmol/L (ref 135–146)
TOTAL PROTEIN: 6.2 g/dL (ref 6.1–8.1)
Total Bilirubin: 0.3 mg/dL (ref 0.2–1.2)

## 2019-06-03 LAB — TSH: TSH: 0.19 m[IU]/L — AB (ref 0.40–4.50)

## 2019-06-03 LAB — HEMOGLOBIN A1C
Hgb A1c MFr Bld: 6.4 % of total Hgb — ABNORMAL HIGH (ref <5.7)
Mean Plasma Glucose: 137 (calc)
eAG (mmol/L): 7.6 (calc)

## 2019-06-03 LAB — T4, FREE: Free T4: 1.1 ng/dL (ref 0.8–1.8)

## 2019-06-09 ENCOUNTER — Other Ambulatory Visit: Payer: Self-pay

## 2019-06-09 ENCOUNTER — Ambulatory Visit (INDEPENDENT_AMBULATORY_CARE_PROVIDER_SITE_OTHER): Payer: Medicare Other | Admitting: "Endocrinology

## 2019-06-09 ENCOUNTER — Encounter: Payer: Self-pay | Admitting: "Endocrinology

## 2019-06-09 VITALS — BP 150/74 | HR 58 | Temp 97.4°F | Ht 68.5 in | Wt 255.4 lb

## 2019-06-09 DIAGNOSIS — E1122 Type 2 diabetes mellitus with diabetic chronic kidney disease: Secondary | ICD-10-CM | POA: Diagnosis not present

## 2019-06-09 DIAGNOSIS — E039 Hypothyroidism, unspecified: Secondary | ICD-10-CM | POA: Diagnosis not present

## 2019-06-09 DIAGNOSIS — Z794 Long term (current) use of insulin: Secondary | ICD-10-CM | POA: Diagnosis not present

## 2019-06-09 DIAGNOSIS — E782 Mixed hyperlipidemia: Secondary | ICD-10-CM | POA: Diagnosis not present

## 2019-06-09 DIAGNOSIS — I10 Essential (primary) hypertension: Secondary | ICD-10-CM

## 2019-06-09 DIAGNOSIS — N183 Chronic kidney disease, stage 3 unspecified: Secondary | ICD-10-CM

## 2019-06-09 NOTE — Patient Instructions (Signed)

## 2019-06-09 NOTE — Progress Notes (Signed)
06/09/2019  Endocrinology follow-up note    Subjective:    Patient ID: Bryce Mejia., male    DOB: 10/15/1946.  he is returning for follow-up visit in the management of currently uncontrolled symptomatic type 2 diabetes, hypothyroidism, hyperlipidemia.   PMD:  Roderic Scarce, MD.   Past Medical History:  Diagnosis Date  . Chronic kidney disease, stage 3 (HCC)    CKD stage 3  . Diabetes mellitus, type II ([place])   . Heart attack ([place])   . Sleep apnea    Past Surgical History:  Procedure Laterality Date  . CARPAL TUNNEL RELEASE    . NOSE SURGERY    . REPLACEMENT TOTAL KNEE     Social History   Socioeconomic History  . Marital status: Widowed    Spouse name: Not on file  . Number of children: Not on file  . Years of education: Not on file  . Highest education level: Not on file  Occupational History  . Not on file  Social Needs  . Financial resource strain: Not on file  . Food insecurity    Worry: Not on file    Inability: Not on file  . Transportation needs    Medical: Not on file    Non-medical: Not on file  Tobacco Use  . Smoking status: Former Smoker    Quit date: 08/16/2007    Years since quitting: 11.8  . Smokeless tobacco: Never Used  Substance and Sexual Activity  . Alcohol use: Yes    Comment: Occassional  . Drug use: No  . Sexual activity: Not on file  Lifestyle  . Physical activity    Days per week: Not on file    Minutes per session: Not on file  . Stress: Not on file  Relationships  . Social Herbalist on phone: Not on file    Gets together: Not on file    Attends religious service: Not on file    Active member of club or organization: Not on file    Attends meetings of clubs or organizations: Not on file    Relationship status: Not on file  Other Topics Concern  . Not on file  Social History Narrative  . Not on file   Outpatient Encounter Medications as of 06/09/2019  Medication Sig  . amoxicillin (AMOXIL)  875 MG tablet amoxicillin 875 mg tablet  . apixaban (ELIQUIS) 5 MG TABS tablet Take 5 mg by mouth 2 (two) times daily.  . Asenapine Maleate (SAPHRIS SL) Saphris (black cherry)  500mg  daily  . aspirin EC 81 MG tablet Take 81 mg by mouth daily.  . cephALEXin (KEFLEX) 500 MG capsule cephalexin 500 mg capsule  . cetirizine (ZYRTEC) 10 MG tablet Take 10 mg by mouth daily.  . Cholecalciferol (VITAMIN D) 2000 units CAPS Take by mouth daily.  . clindamycin (CLEOCIN) 300 MG capsule TAKE 1 CAPSULE BY MOUTH EVERY 8 HOURS FOR 10 DAYS  . clonazePAM (KLONOPIN) 0.5 MG tablet TAKE 1 2 TO 1 (ONE HALF TO ONE) TABLET BY MOUTH TWICE DAILY AS NEEDED FOR ANXIETY  . diazepam (VALIUM) 5 MG tablet diazepam 5 mg tablet  . dicyclomine (BENTYL) 10 MG capsule dicyclomine 10 mg capsule  . EUTHYROX 175 MCG tablet TAKE 1 TABLET BY MOUTH ONCE DAILY BEFORE BREAKFAST  . finasteride (PROSCAR) 5 MG tablet Take 5 mg by mouth daily.  . Flaxseed, Linseed, (FLAXSEED OIL PO) Take by mouth daily.  . fluticasone (  FLONASE) 50 MCG/ACT nasal spray fluticasone propionate 50 mcg/actuation nasal spray,suspension  . furosemide (LASIX) 20 MG tablet Take 20 mg by mouth every other day.  . Garlic 10 MG CAPS garlic  . glucose blood test strip ReliOn Prime Test Strips  1 three times daily  . levofloxacin (LEVAQUIN) 500 MG tablet Take 500 mg by mouth daily.  Marland Kitchen lisinopril (PRINIVIL,ZESTRIL) 10 MG tablet Take 10 mg by mouth daily.  . metoprolol tartrate (LOPRESSOR) 25 MG tablet metoprolol tartrate 25 mg tablet  . Misc Natural Products (BLACK CHERRY CONCENTRATE PO) Take by mouth every morning.  . montelukast (SINGULAIR) 10 MG tablet Take 1 tablet (10 mg total) by mouth at bedtime.  . Multiple Vitamins-Minerals (OCUVITE ADULT 50+ PO) Take by mouth daily.  . Multiple Vitamins-Minerals (OCUVITE ADULT 50+ PO) Ocuvite  1 daily  . nitroGLYCERIN (NITROSTAT) 0.4 MG SL tablet Place under the tongue.  Marland Kitchen NOVOLIN 70/30 RELION (70-30) 100 UNIT/ML injection  INJECT 15 UNITS SUBCUTANEOUSLY TWICE DAILY WITH MEALS  . pantoprazole (PROTONIX) 40 MG tablet   . polyethylene glycol (GAVILYTE-G) 236 g solution GaviLyte-G 236 gram-22.74 gram-6.74 gram-5.86 gram oral solution  . pravastatin (PRAVACHOL) 20 MG tablet Take 20 mg by mouth daily.  Marland Kitchen sulfamethoxazole-trimethoprim (BACTRIM DS,SEPTRA DS) 800-160 MG tablet Take 1 tablet by mouth 2 (two) times daily.  Marland Kitchen terazosin (HYTRIN) 10 MG capsule Take 10 mg by mouth at bedtime.  . traMADol (ULTRAM) 50 MG tablet Take 1 tablet (50 mg total) by mouth every 12 (twelve) hours as needed.  . traZODone (DESYREL) 100 MG tablet Take 100 mg by mouth at bedtime.  . triamcinolone cream (KENALOG) 0.1 % triamcinolone acetonide 0.1 % topical cream  . [DISCONTINUED] clopidogrel (PLAVIX) 75 MG tablet clopidogrel 75 mg tablet  . [DISCONTINUED] diclofenac (CATAFLAM) 50 MG tablet diclofenac potassium 50 mg tablet  . [DISCONTINUED] docusate sodium (DOCQLACE) 100 MG capsule Take 100 mg by mouth 2 (two) times daily.  . [DISCONTINUED] fluticasone (FLOVENT DISKUS) 50 MCG/BLIST diskus inhaler Inhale 1 puff into the lungs 2 (two) times daily.  . [DISCONTINUED] Garlic 2979 MG CAPS Take by mouth.  . [DISCONTINUED] glipiZIDE (GLUCOTROL) 10 MG tablet glipizide 10 mg tablet  . [DISCONTINUED] HYDROcodone-acetaminophen (NORCO/VICODIN) 5-325 MG tablet Take 1 tablet by mouth every 6 (six) hours as needed for moderate pain.  . [DISCONTINUED] insulin aspart (NOVOLOG FLEXPEN) 100 UNIT/ML FlexPen Novolog Flexpen U-100 Insulin aspart 100 unit/mL (3 mL) subcutaneous  . [DISCONTINUED] LORazepam (ATIVAN) 2 MG tablet TAKE 1 TABLET 30 MINUTES PRIOR TO PROCEDURE DO NOT DRIVE FOR 24 HOURS AFTER TAKING THIS MEDICATION  . [DISCONTINUED] Melatonin 5 MG TABS Take by mouth.  . [DISCONTINUED] metroNIDAZOLE (FLAGYL) 250 MG tablet metronidazole 250 mg tablet   No facility-administered encounter medications on file as of 06/09/2019.     ALLERGIES: No Known  Allergies  VACCINATION STATUS:  There is no immunization history on file for this patient.  Diabetes He presents for his follow-up diabetic visit. He has type 2 diabetes mellitus. Onset time: He was diagnosed at approximate age of 68 years. His disease course has been worsening. There are no hypoglycemic associated symptoms. Pertinent negatives for hypoglycemia include no confusion, headaches, pallor or seizures. Pertinent negatives for diabetes include no chest pain, no fatigue, no polydipsia, no polyphagia, no polyuria and no weakness. There are no hypoglycemic complications. Symptoms are worsening. Diabetic complications include heart disease and nephropathy. Risk factors for coronary artery disease include dyslipidemia, diabetes mellitus, hypertension, male sex, obesity, sedentary lifestyle and tobacco exposure.  Current diabetic treatment includes insulin injections and oral agent (monotherapy) (He is currently on Novolin N 20 units twice a day, NovoLog 3-12 units 3 times a day before meals, glipizide 10 mg by mouth twice a day.). His weight is decreasing steadily. He is following a generally unhealthy diet. When asked about meal planning, he reported none. He has not had a previous visit with a dietitian. He never participates in exercise. His breakfast blood glucose range is generally 130-140 mg/dl. His bedtime blood glucose range is generally 130-140 mg/dl. His overall blood glucose range is 130-140 mg/dl. An ACE inhibitor/angiotensin II receptor blocker is being taken.  Hyperlipidemia This is a chronic problem. The current episode started more than 1 year ago. The problem is uncontrolled. Exacerbating diseases include diabetes, hypothyroidism and obesity. Pertinent negatives include no chest pain, myalgias or shortness of breath. Current antihyperlipidemic treatment includes statins. Risk factors for coronary artery disease include diabetes mellitus, dyslipidemia, hypertension, male sex, obesity and  a sedentary lifestyle.  Hypertension This is a chronic problem. The current episode started more than 1 year ago. Pertinent negatives include no chest pain, headaches, neck pain, palpitations or shortness of breath. Risk factors for coronary artery disease include dyslipidemia, diabetes mellitus, male gender, obesity, sedentary lifestyle and smoking/tobacco exposure. Hypertensive end-organ damage includes kidney disease, CAD/MI and heart failure.      Objective:    BP (!) 150/74 (BP Location: Left Arm, Patient Position: Sitting, Cuff Size: Normal)   Pulse (!) 58   Temp (!) 97.4 F (36.3 C) (Oral)   Ht 5' 8.5" (1.74 m)   Wt 255 lb 6.4 oz (115.8 kg)   SpO2 97%   BMI 38.27 kg/m   Wt Readings from Last 3 Encounters:  06/09/19 255 lb 6.4 oz (115.8 kg)  01/06/19 254 lb (115.2 kg)  12/30/18 254 lb (115.2 kg)       Recent Results (from the past 2160 hour(s))  Hemoglobin A1c     Status: Abnormal   Collection Time: 06/02/19  3:51 PM  Result Value Ref Range   Hgb A1c MFr Bld 6.4 (H) <5.7 % of total Hgb    Comment: For someone without known diabetes, a hemoglobin  A1c value between 5.7% and 6.4% is consistent with prediabetes and should be confirmed with a  follow-up test. . For someone with known diabetes, a value <7% indicates that their diabetes is well controlled. A1c targets should be individualized based on duration of diabetes, age, comorbid conditions, and other considerations. . This assay result is consistent with an increased risk of diabetes. . Currently, no consensus exists regarding use of hemoglobin A1c for diagnosis of diabetes for children. .    Mean Plasma Glucose 137 (calc)   eAG (mmol/L) 7.6 (calc)  COMPLETE METABOLIC PANEL WITH GFR     Status: Abnormal   Collection Time: 06/02/19  3:51 PM  Result Value Ref Range   Glucose, Bld 141 (H) 65 - 139 mg/dL    Comment: .        Non-fasting reference interval .    BUN 39 (H) 7 - 25 mg/dL   Creat 2.40 (H)  0.70 - 1.18 mg/dL    Comment: For patients >26 years of age, the reference limit for Creatinine is approximately 13% higher for people identified as African-American. .    GFR, Est Non African American 26 (L) > OR = 60 mL/min/1.60m2   GFR, Est African American 30 (L) > OR = 60 mL/min/1.28m2   BUN/Creatinine Ratio 16  6 - 22 (calc)   Sodium 142 135 - 146 mmol/L   Potassium 5.3 3.5 - 5.3 mmol/L   Chloride 112 (H) 98 - 110 mmol/L   CO2 25 20 - 32 mmol/L   Calcium 8.9 8.6 - 10.3 mg/dL   Total Protein 6.2 6.1 - 8.1 g/dL   Albumin 3.8 3.6 - 5.1 g/dL   Globulin 2.4 1.9 - 3.7 g/dL (calc)   AG Ratio 1.6 1.0 - 2.5 (calc)   Total Bilirubin 0.3 0.2 - 1.2 mg/dL   Alkaline phosphatase (APISO) 54 35 - 144 U/L   AST 20 10 - 35 U/L   ALT 16 9 - 46 U/L  TSH     Status: Abnormal   Collection Time: 06/02/19  3:51 PM  Result Value Ref Range   TSH 0.19 (L) 0.40 - 4.50 mIU/L  T4, free     Status: None   Collection Time: 06/02/19  3:51 PM  Result Value Ref Range   Free T4 1.1 0.8 - 1.8 ng/dL     Assessment & Plan:   1. DM type 2 causing vascular disease ([place]) - Patient has currently uncontrolled symptomatic type 2 DM since  73 years of age. -He returns with near target glycemic profile both fasting and postprandial and A1c of 6.4%, overall improving from 9.5%.      -his diabetes is complicated by coronary artery disease, CKD, obesity/sedentary life and Hammad Finkler. remains at a high risk for more acute and chronic complications which include CAD, CVA, CKD, retinopathy, and neuropathy. These are all discussed in detail with the patient.  - he  admits there is a room for improvement in his diet and drink choices. -  Suggestion is made for him to avoid simple carbohydrates  from his diet including Cakes, Sweet Desserts / Pastries, Ice Cream, Soda (diet and regular), Sweet Tea, Candies, Chips, Cookies, Sweet Pastries,  Store Bought Juices, Alcohol in Excess of  1-2 drinks a day, Artificial  Sweeteners, Coffee Creamer, and "Sugar-free" Products. This will help patient to have stable blood glucose profile and potentially avoid unintended weight gain.  -He has done very well on premixed insulin Novolin 70/30.  He is advised to continue  Novolin  70/30, 15 units with breakfast and 15 units with supper for pre-meal blood glucose readings above 90 mg/dL, associated with strict monitoring of blood glucose 2 times a day-before breakfast and before supper.  - Patient is warned not to take insulin without proper monitoring per orders.  -He is encouraged to call clinic for blood glucose levels less than 70 or above 300 mg /dl.  -He  is not a candidate for  SGLT2 inhibitors due to CKD.  He is advised to discontinue naproxen he is taking for pain which may worsen his renal function. - he will be considered for incretin therapy as appropriate next visit.   3) Lipids/HPL: His most recent lipid panel showed controlled LDL at 48 improving from 90.  He is advised to continue pravastatin 20 mg p.o. nightly.     3) hypothyroidism: - His previsit thyroid function tests are consistent with appropriate replacement.  He is advised to continue levothyroxine 125 mcg p.o.  every morning.     - We discussed about the correct intake of his thyroid hormone, on empty stomach at fasting, with water, separated by at least 30 minutes from breakfast and other medications,  and separated by more than 4 hours from calcium, iron, multivitamins, acid reflux medications (  PPIs). -Patient is made aware of the fact that thyroid hormone replacement is needed for life, dose to be adjusted by periodic monitoring of thyroid function tests.   - I advised patient to maintain close follow up with Roderic Scarce, MD for primary care needs. - Patient Care Time Today:  25 min, of which >50% was spent in reviewing his  current and  previous labs/studies, previous treatments, and medications doses and developing a plan for long-term  care based on the latest recommendations for standards of care.  John Wilber Bihari. participated in the discussions, expressed understanding, and voiced agreement with the above plans.  All questions were answered to his satisfaction. he is encouraged to contact clinic should he have any questions or concerns prior to his return visit.   Follow up plan: - Return in about 6 months (around 12/10/2019) for Follow up with Pre-visit Labs, Meter, and Logs.  Glade Lloyd, MD Phone: [phone]  Fax: [phone]   06/09/2019, 4:23 PM This note was partially dictated with voice recognition software. Similar sounding words can be transcribed inadequately or may not  be corrected upon review.

## 2019-06-11 ENCOUNTER — Other Ambulatory Visit: Payer: Self-pay | Admitting: Physician Assistant

## 2019-06-13 ENCOUNTER — Other Ambulatory Visit (HOSPITAL_COMMUNITY): Payer: Medicare Other

## 2019-06-19 NOTE — Pre-Procedure Instructions (Addendum)
[place], [address] [place] [address] Phone: [phone] Fax: [phone]      Your procedure is scheduled on  06-24-19  Report to [facility] Main Entrance "A" at [place].M., and check in at the Admitting office.  Call this number if you have problems the morning of surgery:  [phone]  Call [phone] if you have any questions prior to your surgery date Monday-Friday 8am-4pm    Remember:  Do not eat  after midnight the night before your surgery  You may drink clear liquids until 0845 AM the morning of your surgery.   Clear liquids allowed are: Water, Non-Citrus Juices (without pulp), Carbonated Beverages, Clear Tea, Black Coffee Only, and Gatorade  Please complete your PRE-SURGERY G2 that was provided to you by ... the morning of surgery.  Please, if able, drink it in one setting. DO NOT SIP.   Take these medicines the morning of surgery with A SIP OF WATER : metoprolol tartrate (LOPRESSOR) cetirizine (ZYRTEC) dicyclomine (BENTYL) EUTHYROX pravastatin (PRAVACHOL) fluticasone (FLONASE). Bring inhaler with you. nitroGLYCERIN (NITROSTAT)as needed traMADol (ULTRAM  Follow your surgeon's instructions on when to stop ELIQUIS and ASPIRIN.  If no instructions were given by your surgeon then you will need to call the office to get those instructions.    7 days prior to surgery STOP taking any Aspirin (unless otherwise instructed by your surgeon), Aleve, Naproxen, Ibuprofen, Motrin, Advil, Goody's, BC's, all herbal medications, fish oil, and all vitamins.   WHAT DO I DO ABOUT MY DIABETES MEDICATION?    THE NIGHT BEFORE SURGERY, take _10___UNITS OF NOVOLIN Insulin    . THE MORNING OF SURGERY, DO NOT TAKE NOVOLIN Insulin.   How to Manage Your Diabetes Before and After Surgery  Why is it important to control my blood sugar before and after surgery? . Improving blood sugar levels before and  after surgery helps healing and can limit problems. . A way of improving blood sugar control is eating a healthy diet by: o  Eating less sugar and carbohydrates o  Increasing activity/exercise o  Talking with your doctor about reaching your blood sugar goals . High blood sugars (greater than 180 mg/dL) can raise your risk of infections and slow your recovery, so you will need to focus on controlling your diabetes during the weeks before surgery. . Make sure that the doctor who takes care of your diabetes knows about your planned surgery including the date and location.  How do I manage my blood sugar before surgery? . Check your blood sugar at least 4 times a day, starting 2 days before surgery, to make sure that the level is not too high or low. o Check your blood sugar the morning of your surgery when you wake up and every 2 hours until you get to the Short Stay unit. . If your blood sugar is less than 70 mg/dL, you will need to treat for low blood sugar: o Do not take insulin. o Treat a low blood sugar (less than 70 mg/dL) with  cup of clear juice (cranberry or apple), 4 glucose tablets, OR glucose gel. o Recheck blood sugar in 15 minutes after treatment (to make sure it is greater than 70 mg/dL). If your blood sugar is not greater than 70 mg/dL on recheck, call [phone] for further instructions. . Report your blood sugar to the short stay nurse when you get to Short Stay.  Marland Kitchen  If you are admitted to the hospital after surgery: o Your blood sugar will be checked by the staff and you will probably be given insulin after surgery (instead of oral diabetes medicines) to make sure you have good blood sugar levels. o The goal for blood sugar control after surgery is 80-180 mg/dL.   The Morning of Surgery  Do not wear jewelry, make-up or nail polish.  Do not wear lotions, powders, or perfumes/colognes   Men may shave face and neck.  Do not bring valuables to the hospital.  [facility] is not  responsible for any belongings or valuables.  If you are a smoker, DO NOT Smoke 24 hours prior to surgery IF you wear a CPAP at night please bring your mask, tubing, and machine the morning of surgery   Remember that you must have someone to transport you home after your surgery, and remain with you for 24 hours if you are discharged the same day.   Contacts, glasses, hearing aids, dentures or bridgework may not be worn into surgery.    Leave your suitcase in the car.  After surgery it may be brought to your room.  For patients admitted to the hospital, discharge time will be determined by your treatment team.  Patients discharged the day of surgery will not be allowed to drive home.    Special instructions:   [facility]- Preparing For Surgery  Before surgery, you can play an important role. Because skin is not sterile, your skin needs to be as free of germs as possible. You can reduce the number of germs on your skin by washing with CHG (chlorahexidine gluconate) Soap before surgery.  CHG is an antiseptic cleaner which kills germs and bonds with the skin to continue killing germs even after washing.    Oral Hygiene is also important to reduce your risk of infection.  Remember - BRUSH YOUR TEETH THE MORNING OF SURGERY WITH YOUR REGULAR TOOTHPASTE  Please do not use if you have an allergy to CHG or antibacterial soaps. If your skin becomes reddened/irritated stop using the CHG.  Do not shave (including legs and underarms) for at least 48 hours prior to first CHG shower. It is OK to shave your face.  Please follow these instructions carefully.   1. Shower the NIGHT BEFORE SURGERY and the MORNING OF SURGERY with CHG Soap.   2. If you chose to wash your hair, wash your hair first as usual with your normal shampoo.  3. After you shampoo, rinse your hair and body thoroughly to remove the shampoo.  4. Use CHG as you would any other liquid soap. You can apply CHG directly to the skin and  wash gently with a scrungie or a clean washcloth.   5. Apply the CHG Soap to your body ONLY FROM THE NECK DOWN.  Do not use on open wounds or open sores. Avoid contact with your eyes, ears, mouth and genitals (private parts). Wash Face and genitals (private parts)  with your normal soap.   6. Wash thoroughly, paying special attention to the area where your surgery will be performed.  7. Thoroughly rinse your body with warm water from the neck down.  8. DO NOT shower/wash with your normal soap after using and rinsing off the CHG Soap.  9. Pat yourself dry with a CLEAN TOWEL.  10. Wear CLEAN PAJAMAS to bed the night before surgery, wear comfortable clothes the morning of surgery  11. Place CLEAN SHEETS on your bed  the night of your first shower and DO NOT SLEEP WITH PETS.    Day of Surgery:  Do not apply any deodorants/lotions. Please shower the morning of surgery with the CHG soap  Please wear clean clothes to the hospital/surgery center.   Remember to brush your teeth WITH YOUR REGULAR TOOTHPASTE.   Please read over the following fact sheets that you were given.

## 2019-06-20 ENCOUNTER — Encounter (HOSPITAL_COMMUNITY)
Admission: RE | Admit: 2019-06-20 | Discharge: 2019-06-20 | Disposition: A | Discharge status: Home / Self Care | Payer: Medicare Other | Source: Ambulatory Visit | Attending: Orthopaedic Surgery | Admitting: Orthopaedic Surgery

## 2019-06-20 ENCOUNTER — Other Ambulatory Visit (HOSPITAL_COMMUNITY)
Admission: RE | Admit: 2019-06-20 | Discharge: 2019-06-20 | Disposition: A | Discharge status: Home / Self Care | Payer: Medicare Other | Source: Ambulatory Visit | Attending: Orthopaedic Surgery | Admitting: Orthopaedic Surgery

## 2019-06-20 ENCOUNTER — Other Ambulatory Visit: Payer: Self-pay

## 2019-06-20 ENCOUNTER — Encounter (HOSPITAL_COMMUNITY): Payer: Self-pay

## 2019-06-20 DIAGNOSIS — Z87891 Personal history of nicotine dependence: Secondary | ICD-10-CM | POA: Diagnosis not present

## 2019-06-20 DIAGNOSIS — Z7902 Long term (current) use of antithrombotics/antiplatelets: Secondary | ICD-10-CM | POA: Insufficient documentation

## 2019-06-20 DIAGNOSIS — I2510 Atherosclerotic heart disease of native coronary artery without angina pectoris: Secondary | ICD-10-CM | POA: Insufficient documentation

## 2019-06-20 DIAGNOSIS — Z01812 Encounter for preprocedural laboratory examination: Secondary | ICD-10-CM | POA: Diagnosis present

## 2019-06-20 DIAGNOSIS — G4733 Obstructive sleep apnea (adult) (pediatric): Secondary | ICD-10-CM | POA: Diagnosis not present

## 2019-06-20 DIAGNOSIS — Z20828 Contact with and (suspected) exposure to other viral communicable diseases: Secondary | ICD-10-CM | POA: Insufficient documentation

## 2019-06-20 DIAGNOSIS — I4892 Unspecified atrial flutter: Secondary | ICD-10-CM | POA: Insufficient documentation

## 2019-06-20 DIAGNOSIS — Z79899 Other long term (current) drug therapy: Secondary | ICD-10-CM | POA: Insufficient documentation

## 2019-06-20 DIAGNOSIS — N183 Chronic kidney disease, stage 3 (moderate): Secondary | ICD-10-CM | POA: Insufficient documentation

## 2019-06-20 DIAGNOSIS — I252 Old myocardial infarction: Secondary | ICD-10-CM | POA: Insufficient documentation

## 2019-06-20 DIAGNOSIS — Z7982 Long term (current) use of aspirin: Secondary | ICD-10-CM | POA: Insufficient documentation

## 2019-06-20 DIAGNOSIS — E1122 Type 2 diabetes mellitus with diabetic chronic kidney disease: Secondary | ICD-10-CM | POA: Insufficient documentation

## 2019-06-20 DIAGNOSIS — M1711 Unilateral primary osteoarthritis, right knee: Secondary | ICD-10-CM | POA: Diagnosis not present

## 2019-06-20 HISTORY — DX: Personal history of other diseases of the circulatory system: Z86.79

## 2019-06-20 HISTORY — DX: Family history of other specified conditions: Z84.89

## 2019-06-20 LAB — BASIC METABOLIC PANEL
Anion gap: 7 (ref 5–15)
BUN: 33 mg/dL — ABNORMAL HIGH (ref 8–23)
CO2: 23 mmol/L (ref 22–32)
Calcium: 9.3 mg/dL (ref 8.9–10.3)
Chloride: 110 mmol/L (ref 98–111)
Creatinine, Ser: 2.1 mg/dL — ABNORMAL HIGH (ref 0.61–1.24)
GFR calc Af Amer: 35 mL/min — ABNORMAL LOW (ref >60)
GFR calc non Af Amer: 30 mL/min — ABNORMAL LOW (ref >60)
Glucose, Bld: 144 mg/dL — ABNORMAL HIGH (ref 70–99)
Potassium: 5.1 mmol/L (ref 3.5–5.1)
Sodium: 140 mmol/L (ref 135–145)

## 2019-06-20 LAB — SURGICAL PCR SCREEN
MRSA, PCR: NEGATIVE
Staphylococcus aureus: NEGATIVE

## 2019-06-20 LAB — CBC
HCT: 35.5 % — ABNORMAL LOW (ref 39.0–52.0)
Hemoglobin: 11.2 g/dL — ABNORMAL LOW (ref 13.0–17.0)
MCH: 31.3 pg (ref 26.0–34.0)
MCHC: 31.5 g/dL (ref 30.0–36.0)
MCV: 99.2 fL (ref 80.0–100.0)
Platelets: 125 10*3/uL — ABNORMAL LOW (ref 150–400)
RBC: 3.58 MIL/uL — ABNORMAL LOW (ref 4.22–5.81)
RDW: 13.2 % (ref 11.5–15.5)
WBC: 5.9 10*3/uL (ref 4.0–10.5)
nRBC: 0 % (ref 0.0–0.2)

## 2019-06-20 LAB — SARS CORONAVIRUS 2 (TAT 6-24 HRS): SARS Coronavirus 2: NEGATIVE

## 2019-06-20 LAB — GLUCOSE, CAPILLARY: Glucose-Capillary: 109 mg/dL — ABNORMAL HIGH (ref 70–99)

## 2019-06-20 NOTE — Progress Notes (Addendum)
  Coronavirus Screening COVID test completed 06/20/19 Have you experienced the following symptoms:  Cough yes/no: No Fever (>100.92F)  yes/no: No Runny nose yes/no: No Sore throat yes/no: No Difficulty breathing/shortness of breath  yes/no: No Have you or a family member traveled in the last 14 days and where? yes/no: No  PCP -  Valentina Shaggy, NP   Cardiologist - Dr Cleora Fleet  Endocrinologist- Dr Rose Fillers- Dr Orvan Seen  Urologist-Dr April Cassidy  Pulmonologist-Dr. Grossman  Dermatologist- Dr. Ronnald Ramp   Chest x-ray - Records requested  EKG - Records requested from Cardiology  Stress Test - records requested  ECHO - Records requested  Cardiac Cath - records requested  AICD-denies PM-denies LOOP-denies  Sleep Study - y CPAP - Y  LABS-PCR,CBC,BMP  ASA-81mg . 06/20/19 Eliquis-06/20/19  ERAS-G2 given with instructions  HA1C-6.4(06/02/19). Fasting Blood Sugar - 109. Lowest-88, Highest- 210 Checks Blood Sugar __2___ times a day  Anesthesia-Y. Abnormal labs. [facility] aware.  Pt denies having chest pain, sob, or fever at this time. All instructions explained to the pt, with a verbal understanding of the material. Pt agrees to go over the instructions while at home for a better understanding. Pt also instructed to self quarantine after being tested for COVID-19. The opportunity to ask questions was provided.

## 2019-06-23 ENCOUNTER — Encounter (HOSPITAL_COMMUNITY): Payer: Self-pay

## 2019-06-23 MED ORDER — TRANEXAMIC ACID-NACL 1000-0.7 MG/100ML-% IV SOLN
1000.0000 mg | INTRAVENOUS | Status: AC
  Administered 2019-06-24: 12:00:00 1000 mg via INTRAVENOUS
  Filled 2019-06-23: qty 100

## 2019-06-23 NOTE — Progress Notes (Addendum)
Anesthesia Chart Review:  Case: [id number] Date/Time: 06/24/19 1130   Procedure: RIGHT TOTAL KNEE ARTHROPLASTY (Right )   Anesthesia type: Spinal   Pre-op diagnosis: Osteoarthritis Right Knee   Location: [facility] ROOM 04 / [place] [facility]   Surgeon: Mcarthur Rossetti, MD     At [facility] he is under the name Jonette Eva, Brooke Bonito.   DISCUSSION: Patient is a 73 year old male scheduled for the above procedure.  History includes former smoker, DM2, CAD (MI 04/2014 s/p stents), aflutter (s/p DCCV 08/01/17), CKD stage III, OSA (CPAP). Nephrology notes indicate that he also had GI and hematology evaluation for anemia (Hgb 5.6, s/p 4 Units PRBC ~ 06/2018) and was found to have low iron and small GI ulcer/inflammation. BMI is consistent with obesity.  He denied chest pain and SOB at PAT. Awaiting cardiology records from [facility]. Requested this morning (06/23/19) and called at 2:45 PM. Staff would not send without speak with Dr. Bennett Scrape regarding clearance status. He was reportedly seen in 04/2019. Given delay in getting cardiology records, I called and spoke with patient. He denied Nitro use. Reports, he had a stress test in 06/2018 (for worsening dyspnea) and was considered for cardiac cath, but preoperative labs showed HGB in the 5 range. Cath was cancelled, and he was transfused and given IV iron infusions with improvement of his symptoms, so cath not pursued. To patient's understanding, Dr. Rosalita Chessman is aware of surgery plans and reportedly had told him he could hold Eliquis for 2 days played      Last seen by nephrology on 05/26/19 with labs (BUN 49, Cr 2.06, H/H 11.9/35.0, PLT 136). Notes indicate his baseline Cr is ~ 2.0. Preoperative labs appear stable.  Last ASA 06/20/19. Last Eliquis 06/20/19 per PAT RN note--HOWEVER, when I talked with patient on 06/23/19 PM, he said last dose 06/21/19 after his evening dose per his cardiologists instructions. ASA also held then. Discussed that his  anesthesiologists may opt for general anesthesia over spinal based on last Eliquis dose, but will defer to anesthesiologist. I also updated Sherrie at Dr. Trevor Mace office regarding possibility of general (over spinal) anesthesia and that I am still awaiting cardiology records and would need these prior to patient having surgery.   Presurgical COVID-19 test negative on 06/20/2019.   ADDENDUM 06/23/19 4:50 PM: Cardiology clearance letter signed by Dr. Rosalita Chessman on 06/23/19. Copy on hard chart. Records from [facility] (hospital) also received and outlined below, however, still missing Dr. Milta Deiters 04/2019 office note and EKG and 06/2018 stress test. I called again to re-request from [place], but the office was closed. Will follow-up on the records asap on 06/24/19. (UPDATE 06/24/19 9:49 AM: Received additional cardiology records. Last seen by Dr. Rosalita Chessman 05/01/19 for follow-up and preoperative evaluation. As patient reported, Dr. Rosalita Chessman had initially asked him to stop Eliquis for "24-48 hours" prior to surgery. Last EKG and stress test outlined below.)    VS: BP (!) 161/66   Pulse 60   Temp 36.8 C   Resp 20   Ht 5' 8.5" (1.74 m)   Wt 115.8 kg   SpO2 98%   BMI 38.25 kg/m     PROVIDERS: Valentina Shaggy, NP is PCP  Cleora Fleet, MD is cardiologist ([facility]) Loreli Slot, MD is nephrologist. Last visit 05/26/19 with Orene Desanctis, [place]. He is also seen by Shellee Milo, FNP in urology there. [facility]) Loni Beckwith, MD is  endocrinologist Merrily Brittle, MD is pulmonologist   LABS: Labs reviewed: Acceptable for surgery.  Labs appear around his baseline. A1c 6.4 on 06/02/19. (all labs ordered are listed, but only abnormal results are displayed)  Labs Reviewed  GLUCOSE, CAPILLARY - Abnormal; Notable for the following components:      Result Value   Glucose-Capillary 109 (*)    All other components within  normal limits  BASIC METABOLIC PANEL - Abnormal; Notable for the following components:   Glucose, Bld 144 (*)    BUN 33 (*)    Creatinine, Ser 2.10 (*)    GFR calc non Af Amer 30 (*)    GFR calc Af Amer 35 (*)    All other components within normal limits  CBC - Abnormal; Notable for the following components:   RBC 3.58 (*)    Hemoglobin 11.2 (*)    HCT 35.5 (*)    Platelets 125 (*)    All other components within normal limits  SURGICAL PCR SCREEN    EKG: 05/01/19 ([facility]): SR. RBBB with LAFB. Bifascicular block.   CV: Nuclear stress test 06/21/18 ([facility]) Requested.  Summary:  Overall: This vasodilator stress test is positive for ischemia in an area of myocardium already infarcted.  LVEF 64%.   Perfusion imaging: There is a small partially reversible perfusion abnormality of moderate intensity in the inferior and inferior lateral walls from the base to the mid wall.  The remainder the ventricle has normal perfusion at rest and with stress. Wall motion: Demonstrates normal myocardial thickening and wall motion.  The LV cavity size is normal at rest and with stress.  Ejection fraction is 64%. Impression: Abnormal - According to patient, stress test was done for worsening dyspnea which was ultimately attributed to severe anemia (HGB < 6), so LHC not pursued. According to 05/01/19 office note by Dr. Rosalita Chessman, "06/21/2018 MPI: Chest pain after Lexiscan administration, no ischemia on electrocardiogram, inferior inferior lateral wall infarct with minimal peri-infarct ischemia"  He was found to be in atrial flutter with a HR of 45 bpm on 07/31/17 stress test, although final report not received. He ended up getting a TEE and underwent DCCV 08/01/17.   TEE 08/01/17 ([facility]): Findings: Ejection fraction 60%, no segmental wall motion abnormality.  Normal right ventricular function. Atrioventricular septum appear to be intact. Aortic valve sclerosis with no stenosis. Left atrial  appendage visualized, no definite thrombus seen. Doppler and color flow shows trace mitral regurgitation. Impression: Normal left ventricular systolic function with no evidence of left atrial or left atrial appendage thrombus.  To proceed with DC cardioversion.  Echo 07/31/17 ([facility]): Interpretation: Normal left ventricular systolic function.  Ejection fraction 65%. Concentric LVH.  Grade 3 diastolic dysfunction. Mild mitral regurgitation, aortic insufficiency, and trace tricuspid regurgitation.   Dilated right ventricle with normal contractility. Aortic root mildly dilated measuring 4.2 cm.  Cardiac cath 04/28/14 ([facility]): Indication: 73 year old male with percent no angina, abnormal resting EKG (Wenckebach) and troponin of 0.5. Hemodynamics: Central aortic pressure was 140/80. Left coronary angiogram: Left main is normal.  Large vessel.  This vessel bifurcates into LAD and left circumflex.  The LAD has a 50% stenosis in the proximal to mid segment.  Average size diagonal branch in the mid LAD with no significant disease.  The distal LAD to the apex has multiple 40 to 50% lesions.  The left circumflex again is a large vessel.  It was totally occluded in the midsegment.  After stenting, appeared to have excellent flow.  Again, the stent was slightly oversized.  There was no distal collapse with TIMI-3 flow.  No loss of side branches. Right coronary angiogram: There was an ostial lesion AV nodal branch.  This appeared to be tight, in the range of may be 80%.  This vessel has a long 50% stenosis in the proximal RCA to the mid RCA.  The PDA has a 25 to 30% stenosis. PCI: Circumflex lesion was stented using a 3.0 x 32 mm PROMUS stent.  It was a long lesion with multiple stenotic areas.  When deployed the stent appeared to be mildly oversized at nominal 11 atmospheres. There was some distal haziness distal to the stent.  Intracoronary nitroglycerin and Integrilin were given.  The ACT was  in excess of 200.  Improved.  At the end of the case, there was no distal collapse and no evidence of dissection, patient was pain-free. Decision made to stop as TIMA-3 flow.  There was a 50% lesion in the LAD as well as RCA which will be treated medically. Impression: Nonobstructive disease in the left anterior descending and right coronary artery as outlined above with total occlusion of the left circumflex appears to be the culprit.  Good angiographic results.  Again, the stent is slightly oversized, but no evidence of dissection.   Past Medical History:  Diagnosis Date  . Chronic kidney disease, stage 3 (HCC)    CKD stage 3  . Diabetes mellitus, type II ([place])   . Family history of adverse reaction to anesthesia    Mom- difficult to wake up from anesthesia  . Heart attack ([place])   . Sleep apnea     Past Surgical History:  Procedure Laterality Date  . CARPAL TUNNEL RELEASE    . NOSE SURGERY    . REPLACEMENT TOTAL KNEE      MEDICATIONS: . amoxicillin (AMOXIL) 875 MG tablet  . apixaban (ELIQUIS) 5 MG TABS tablet  . Asenapine Maleate (SAPHRIS SL)  . aspirin EC 81 MG tablet  . cephALEXin (KEFLEX) 500 MG capsule  . cetirizine (ZYRTEC) 10 MG tablet  . Cholecalciferol (VITAMIN D) 2000 units CAPS  . clindamycin (CLEOCIN) 300 MG capsule  . clonazePAM (KLONOPIN) 0.5 MG tablet  . diazepam (VALIUM) 5 MG tablet  . dicyclomine (BENTYL) 10 MG capsule  . EUTHYROX 175 MCG tablet  . finasteride (PROSCAR) 5 MG tablet  . Flaxseed, Linseed, (FLAXSEED OIL PO)  . fluticasone (FLONASE) 50 MCG/ACT nasal spray  . furosemide (LASIX) 20 MG tablet  . Garlic 10 MG CAPS  . glucose blood test strip  . levofloxacin (LEVAQUIN) 500 MG tablet  . lisinopril (PRINIVIL,ZESTRIL) 10 MG tablet  . metoprolol tartrate (LOPRESSOR) 25 MG tablet  . Misc Natural Products (BLACK CHERRY CONCENTRATE PO)  . montelukast (SINGULAIR) 10 MG tablet  . Multiple Vitamins-Minerals (OCUVITE ADULT 50+ PO)  . Multiple  Vitamins-Minerals (OCUVITE ADULT 50+ PO)  . nitroGLYCERIN (NITROSTAT) 0.4 MG SL tablet  . NOVOLIN 70/30 RELION (70-30) 100 UNIT/ML injection  . pantoprazole (PROTONIX) 40 MG tablet  . polyethylene glycol (GAVILYTE-G) 236 g solution  . pravastatin (PRAVACHOL) 20 MG tablet  . sulfamethoxazole-trimethoprim (BACTRIM DS,SEPTRA DS) 800-160 MG tablet  . terazosin (HYTRIN) 10 MG capsule  . traMADol (ULTRAM) 50 MG tablet  . traZODone (DESYREL) 100 MG tablet  . triamcinolone cream (KENALOG) 0.1 %   No current facility-administered medications for this encounter.   I confirmed with patient that he is no longer on any antibiotics.    Myra Gianotti, PA-C  Surgical Short Stay/Anesthesiology [facility] Phone [phone] [facility] Phone [phone] 06/23/2019 3:43 PM

## 2019-06-23 NOTE — Anesthesia Preprocedure Evaluation (Addendum)
Anesthesia Evaluation  Patient identified by MRN, date of birth, ID band Patient awake    Reviewed: Allergy & Precautions, NPO status , Patient's Chart, lab work & pertinent test results, reviewed documented beta blocker date and time   Airway Mallampati: II  TM Distance: >3 FB Neck ROM: Full    Dental  (+) Dental Advisory Given   Pulmonary sleep apnea , former smoker,    breath sounds clear to auscultation       Cardiovascular hypertension, Pt. on medications and Pt. on home beta blockers + Past MI, + Cardiac Stents and + Peripheral Vascular Disease  + dysrhythmias Atrial Fibrillation  Rhythm:Irregular Rate:Bradycardia     Neuro/Psych negative neurological ROS     GI/Hepatic negative GI ROS, Neg liver ROS,   Endo/Other  diabetesHypothyroidism   Renal/GU CRFRenal disease     Musculoskeletal  (+) Arthritis ,   Abdominal   Peds  Hematology negative hematology ROS (+)   Anesthesia Other Findings   Reproductive/Obstetrics                            Lab Results  Component Value Date   WBC 5.9 06/20/2019   HGB 11.2 (L) 06/20/2019   HCT 35.5 (L) 06/20/2019   MCV 99.2 06/20/2019   PLT 125 (L) 06/20/2019   Lab Results  Component Value Date   CREATININE 2.10 (H) 06/20/2019   BUN 33 (H) 06/20/2019   NA 140 06/20/2019   K 5.1 06/20/2019   CL 110 06/20/2019   CO2 23 06/20/2019   COVID-19 Labs  No results for input(s): DDIMER, FERRITIN, LDH, CRP in the last 72 hours.  Lab Results  Component Value Date   [place] NEGATIVE 06/20/2019    Anesthesia Physical Anesthesia Plan  ASA: III  Anesthesia Plan: General   Post-op Pain Management:  Regional for Post-op pain   Induction: Intravenous  PONV Risk Score and Plan: 2 and Dexamethasone, Ondansetron and Treatment may vary due to age or medical condition  Airway Management Planned: LMA  Additional Equipment:   Intra-op Plan:    Post-operative Plan: Extubation in OR  Informed Consent: I have reviewed the patients History and Physical, chart, labs and discussed the procedure including the risks, benefits and alternatives for the proposed anesthesia with the patient or authorized representative who has indicated his/her understanding and acceptance.     Dental advisory given  Plan Discussed with: CRNA  Anesthesia Plan Comments: ( )      Anesthesia Quick Evaluation

## 2019-06-24 ENCOUNTER — Encounter (HOSPITAL_COMMUNITY)
Admission: RE | Disposition: A | Discharge status: Skilled Nursing Facility | Payer: Self-pay | Source: Home / Self Care | Attending: Orthopaedic Surgery

## 2019-06-24 ENCOUNTER — Ambulatory Visit (HOSPITAL_COMMUNITY): Payer: Medicare Other | Admitting: Vascular Surgery

## 2019-06-24 ENCOUNTER — Encounter (HOSPITAL_COMMUNITY): Payer: Self-pay | Admitting: Emergency Medicine

## 2019-06-24 ENCOUNTER — Inpatient Hospital Stay (HOSPITAL_COMMUNITY)
Admission: RE | Admit: 2019-06-24 | Discharge: 2019-06-28 | DRG: 470 | Disposition: A | Discharge status: Skilled Nursing Facility | Payer: Medicare Other | Attending: Orthopaedic Surgery | Admitting: Orthopaedic Surgery

## 2019-06-24 ENCOUNTER — Inpatient Hospital Stay (HOSPITAL_COMMUNITY): Payer: Medicare Other

## 2019-06-24 ENCOUNTER — Ambulatory Visit (HOSPITAL_COMMUNITY): Payer: Medicare Other | Admitting: Anesthesiology

## 2019-06-24 ENCOUNTER — Other Ambulatory Visit: Payer: Self-pay

## 2019-06-24 DIAGNOSIS — I252 Old myocardial infarction: Secondary | ICD-10-CM | POA: Diagnosis not present

## 2019-06-24 DIAGNOSIS — M1711 Unilateral primary osteoarthritis, right knee: Principal | ICD-10-CM | POA: Diagnosis present

## 2019-06-24 DIAGNOSIS — Z96652 Presence of left artificial knee joint: Secondary | ICD-10-CM | POA: Diagnosis present

## 2019-06-24 DIAGNOSIS — Z8679 Personal history of other diseases of the circulatory system: Secondary | ICD-10-CM

## 2019-06-24 DIAGNOSIS — Z6838 Body mass index (BMI) 38.0-38.9, adult: Secondary | ICD-10-CM

## 2019-06-24 DIAGNOSIS — M25761 Osteophyte, right knee: Secondary | ICD-10-CM | POA: Diagnosis present

## 2019-06-24 DIAGNOSIS — N183 Chronic kidney disease, stage 3 (moderate): Secondary | ICD-10-CM | POA: Diagnosis present

## 2019-06-24 DIAGNOSIS — Z794 Long term (current) use of insulin: Secondary | ICD-10-CM | POA: Diagnosis not present

## 2019-06-24 DIAGNOSIS — I2510 Atherosclerotic heart disease of native coronary artery without angina pectoris: Secondary | ICD-10-CM | POA: Diagnosis present

## 2019-06-24 DIAGNOSIS — E1122 Type 2 diabetes mellitus with diabetic chronic kidney disease: Secondary | ICD-10-CM | POA: Diagnosis present

## 2019-06-24 DIAGNOSIS — Z87891 Personal history of nicotine dependence: Secondary | ICD-10-CM | POA: Diagnosis not present

## 2019-06-24 DIAGNOSIS — E039 Hypothyroidism, unspecified: Secondary | ICD-10-CM | POA: Diagnosis present

## 2019-06-24 DIAGNOSIS — E1151 Type 2 diabetes mellitus with diabetic peripheral angiopathy without gangrene: Secondary | ICD-10-CM | POA: Diagnosis present

## 2019-06-24 DIAGNOSIS — I129 Hypertensive chronic kidney disease with stage 1 through stage 4 chronic kidney disease, or unspecified chronic kidney disease: Secondary | ICD-10-CM | POA: Diagnosis present

## 2019-06-24 DIAGNOSIS — Z7901 Long term (current) use of anticoagulants: Secondary | ICD-10-CM

## 2019-06-24 DIAGNOSIS — G4730 Sleep apnea, unspecified: Secondary | ICD-10-CM | POA: Diagnosis present

## 2019-06-24 DIAGNOSIS — Z79899 Other long term (current) drug therapy: Secondary | ICD-10-CM | POA: Diagnosis not present

## 2019-06-24 DIAGNOSIS — E782 Mixed hyperlipidemia: Secondary | ICD-10-CM | POA: Diagnosis present

## 2019-06-24 DIAGNOSIS — Z96651 Presence of right artificial knee joint: Secondary | ICD-10-CM

## 2019-06-24 DIAGNOSIS — Z20828 Contact with and (suspected) exposure to other viral communicable diseases: Secondary | ICD-10-CM | POA: Diagnosis not present

## 2019-06-24 HISTORY — PX: TOTAL KNEE ARTHROPLASTY: SHX125

## 2019-06-24 LAB — GLUCOSE, CAPILLARY
Glucose-Capillary: 112 mg/dL — ABNORMAL HIGH (ref 70–99)
Glucose-Capillary: 133 mg/dL — ABNORMAL HIGH (ref 70–99)
Glucose-Capillary: 145 mg/dL — ABNORMAL HIGH (ref 70–99)
Glucose-Capillary: 171 mg/dL — ABNORMAL HIGH (ref 70–99)

## 2019-06-24 SURGERY — ARTHROPLASTY, KNEE, TOTAL
Anesthesia: General | Site: Knee | Laterality: Right

## 2019-06-24 MED ORDER — METOCLOPRAMIDE HCL 5 MG PO TABS: 5.0000 mg | ORAL_TABLET | Freq: Three times a day (TID) | ORAL | Status: DC | PRN

## 2019-06-24 MED ORDER — APIXABAN 5 MG PO TABS
5.0000 mg | ORAL_TABLET | Freq: Two times a day (BID) | ORAL | Status: DC
  Administered 2019-06-25 – 2019-06-28 (×7): 5 mg via ORAL
  Filled 2019-06-24 (×7): qty 1

## 2019-06-24 MED ORDER — OXYCODONE HCL 5 MG PO TABS
10.0000 mg | ORAL_TABLET | ORAL | Status: DC | PRN
  Administered 2019-06-24: 15:00:00 10 mg via ORAL
  Administered 2019-06-24 – 2019-06-26 (×7): 15 mg via ORAL
  Administered 2019-06-27 – 2019-06-28 (×3): 10 mg via ORAL
  Filled 2019-06-24 (×2): qty 3
  Filled 2019-06-24: qty 2
  Filled 2019-06-24 (×3): qty 3
  Filled 2019-06-24: qty 2
  Filled 2019-06-24 (×2): qty 3

## 2019-06-24 MED ORDER — CEFAZOLIN SODIUM-DEXTROSE 2-4 GM/100ML-% IV SOLN
INTRAVENOUS | Status: AC
  Filled 2019-06-24: qty 100

## 2019-06-24 MED ORDER — VITAMIN D 25 MCG (1000 UNIT) PO TABS
2000.0000 [IU] | ORAL_TABLET | Freq: Every day | ORAL | Status: DC
  Administered 2019-06-25 – 2019-06-28 (×4): 2000 [IU] via ORAL
  Filled 2019-06-24 (×4): qty 2

## 2019-06-24 MED ORDER — POLYETHYLENE GLYCOL 3350 17 G PO PACK: 17.0000 g | PACK | Freq: Every day | ORAL | Status: DC | PRN

## 2019-06-24 MED ORDER — TRANEXAMIC ACID-NACL 1000-0.7 MG/100ML-% IV SOLN
INTRAVENOUS | Status: AC
  Filled 2019-06-24: qty 200

## 2019-06-24 MED ORDER — PRAVASTATIN SODIUM 10 MG PO TABS
20.0000 mg | ORAL_TABLET | Freq: Every day | ORAL | Status: DC
  Administered 2019-06-24 – 2019-06-27 (×4): 20 mg via ORAL
  Filled 2019-06-24 (×4): qty 2

## 2019-06-24 MED ORDER — HYDROMORPHONE HCL 1 MG/ML IJ SOLN
0.5000 mg | INTRAMUSCULAR | Status: DC | PRN
  Administered 2019-06-24 – 2019-06-28 (×9): 1 mg via INTRAVENOUS
  Filled 2019-06-24 (×10): qty 1

## 2019-06-24 MED ORDER — LISINOPRIL 10 MG PO TABS
10.0000 mg | ORAL_TABLET | Freq: Every day | ORAL | Status: DC
  Administered 2019-06-24 – 2019-06-28 (×5): 10 mg via ORAL
  Filled 2019-06-24 (×5): qty 1

## 2019-06-24 MED ORDER — ALUM & MAG HYDROXIDE-SIMETH 200-200-20 MG/5ML PO SUSP: 30.0000 mL | ORAL | Status: DC | PRN

## 2019-06-24 MED ORDER — LEVOTHYROXINE SODIUM 75 MCG PO TABS
175.0000 ug | ORAL_TABLET | Freq: Every day | ORAL | Status: DC
  Administered 2019-06-25 – 2019-06-28 (×4): 175 ug via ORAL
  Filled 2019-06-24 (×4): qty 1

## 2019-06-24 MED ORDER — CEFAZOLIN SODIUM-DEXTROSE 2-4 GM/100ML-% IV SOLN
2.0000 g | INTRAVENOUS | Status: AC
  Administered 2019-06-24: 12:00:00 2 g via INTRAVENOUS

## 2019-06-24 MED ORDER — ONDANSETRON HCL 4 MG/2ML IJ SOLN
INTRAMUSCULAR | Status: DC | PRN
  Administered 2019-06-24: 4 mg via INTRAVENOUS

## 2019-06-24 MED ORDER — ONDANSETRON HCL 4 MG/2ML IJ SOLN
4.0000 mg | Freq: Four times a day (QID) | INTRAMUSCULAR | Status: DC | PRN
  Administered 2019-06-25: 4 mg via INTRAVENOUS
  Filled 2019-06-24: qty 2

## 2019-06-24 MED ORDER — METHOCARBAMOL 1000 MG/10ML IJ SOLN
500.0000 mg | Freq: Four times a day (QID) | INTRAVENOUS | Status: DC | PRN
  Filled 2019-06-24: qty 5

## 2019-06-24 MED ORDER — CEFAZOLIN SODIUM-DEXTROSE 2-4 GM/100ML-% IV SOLN
2.0000 g | Freq: Four times a day (QID) | INTRAVENOUS | Status: AC
  Administered 2019-06-24 (×2): 2 g via INTRAVENOUS
  Filled 2019-06-24 (×2): qty 100

## 2019-06-24 MED ORDER — DEXMEDETOMIDINE HCL 200 MCG/2ML IV SOLN
INTRAVENOUS | Status: DC | PRN
  Administered 2019-06-24: 12 ug via INTRAVENOUS

## 2019-06-24 MED ORDER — MONTELUKAST SODIUM 10 MG PO TABS
10.0000 mg | ORAL_TABLET | Freq: Every day | ORAL | Status: DC
  Administered 2019-06-24 – 2019-06-27 (×4): 10 mg via ORAL
  Filled 2019-06-24 (×4): qty 1

## 2019-06-24 MED ORDER — PHENOL 1.4 % MT LIQD: 1.0000 | OROMUCOSAL | Status: DC | PRN

## 2019-06-24 MED ORDER — FENTANYL CITRATE (PF) 250 MCG/5ML IJ SOLN
INTRAMUSCULAR | Status: AC
  Filled 2019-06-24: qty 5

## 2019-06-24 MED ORDER — FENTANYL CITRATE (PF) 250 MCG/5ML IJ SOLN
INTRAMUSCULAR | Status: DC | PRN
  Administered 2019-06-24: 50 ug via INTRAVENOUS
  Administered 2019-06-24 (×2): 25 ug via INTRAVENOUS
  Administered 2019-06-24 (×3): 50 ug via INTRAVENOUS

## 2019-06-24 MED ORDER — HYDROMORPHONE HCL 1 MG/ML IJ SOLN
INTRAMUSCULAR | Status: AC
  Administered 2019-06-24: 16:00:00 1 mg via INTRAVENOUS
  Filled 2019-06-24: qty 1

## 2019-06-24 MED ORDER — PANTOPRAZOLE SODIUM 40 MG PO TBEC
40.0000 mg | DELAYED_RELEASE_TABLET | Freq: Every day | ORAL | Status: DC
  Administered 2019-06-25 – 2019-06-28 (×4): 40 mg via ORAL
  Filled 2019-06-24 (×4): qty 1

## 2019-06-24 MED ORDER — HYDROMORPHONE HCL 1 MG/ML IJ SOLN
INTRAMUSCULAR | Status: AC
  Filled 2019-06-24: qty 0.5

## 2019-06-24 MED ORDER — FUROSEMIDE 20 MG PO TABS
20.0000 mg | ORAL_TABLET | ORAL | Status: DC
  Administered 2019-06-25 – 2019-06-27 (×2): 20 mg via ORAL
  Filled 2019-06-24 (×3): qty 1

## 2019-06-24 MED ORDER — FINASTERIDE 5 MG PO TABS
5.0000 mg | ORAL_TABLET | Freq: Every day | ORAL | Status: DC
  Administered 2019-06-25 – 2019-06-28 (×4): 5 mg via ORAL
  Filled 2019-06-24 (×4): qty 1

## 2019-06-24 MED ORDER — PHENYLEPHRINE 40 MCG/ML (10ML) SYRINGE FOR IV PUSH (FOR BLOOD PRESSURE SUPPORT)
PREFILLED_SYRINGE | INTRAVENOUS | Status: AC
  Filled 2019-06-24: qty 10

## 2019-06-24 MED ORDER — SODIUM CHLORIDE 0.9 % IV SOLN
INTRAVENOUS | Status: DC
  Administered 2019-06-24 (×2): via INTRAVENOUS

## 2019-06-24 MED ORDER — METOCLOPRAMIDE HCL 5 MG/ML IJ SOLN: 5.0000 mg | Freq: Three times a day (TID) | INTRAMUSCULAR | Status: DC | PRN

## 2019-06-24 MED ORDER — PROPOFOL 10 MG/ML IV BOLUS
INTRAVENOUS | Status: AC
  Filled 2019-06-24: qty 20

## 2019-06-24 MED ORDER — SODIUM CHLORIDE 0.9 % IR SOLN
Status: DC | PRN
  Administered 2019-06-24: 3000 mL

## 2019-06-24 MED ORDER — GLYCOPYRROLATE 0.2 MG/ML IJ SOLN
INTRAMUSCULAR | Status: DC | PRN
  Administered 2019-06-24: 0.2 mg via INTRAVENOUS

## 2019-06-24 MED ORDER — ZOLPIDEM TARTRATE 5 MG PO TABS: 5.0000 mg | ORAL_TABLET | Freq: Every evening | ORAL | Status: DC | PRN

## 2019-06-24 MED ORDER — DOCUSATE SODIUM 100 MG PO CAPS
100.0000 mg | ORAL_CAPSULE | Freq: Two times a day (BID) | ORAL | Status: DC
  Administered 2019-06-24 – 2019-06-28 (×8): 100 mg via ORAL
  Filled 2019-06-24 (×8): qty 1

## 2019-06-24 MED ORDER — SODIUM CHLORIDE 0.9 % IV SOLN
INTRAVENOUS | Status: DC
  Administered 2019-06-24 – 2019-06-25 (×3): via INTRAVENOUS

## 2019-06-24 MED ORDER — OXYCODONE HCL 5 MG PO TABS
ORAL_TABLET | ORAL | Status: AC
  Filled 2019-06-24: qty 2

## 2019-06-24 MED ORDER — ONDANSETRON HCL 4 MG/2ML IJ SOLN: 4.0000 mg | Freq: Once | INTRAMUSCULAR | Status: DC | PRN

## 2019-06-24 MED ORDER — ONDANSETRON HCL 4 MG PO TABS: 4.0000 mg | ORAL_TABLET | Freq: Four times a day (QID) | ORAL | Status: DC | PRN

## 2019-06-24 MED ORDER — HYDROMORPHONE HCL 1 MG/ML IJ SOLN
INTRAMUSCULAR | Status: DC | PRN
  Administered 2019-06-24 (×2): 0.5 mg via INTRAVENOUS

## 2019-06-24 MED ORDER — FENTANYL CITRATE (PF) 100 MCG/2ML IJ SOLN
INTRAMUSCULAR | Status: AC
  Administered 2019-06-24: 12:00:00 50 ug
  Filled 2019-06-24: qty 2

## 2019-06-24 MED ORDER — DIPHENHYDRAMINE HCL 12.5 MG/5ML PO ELIX: 12.5000 mg | ORAL_SOLUTION | ORAL | Status: DC | PRN

## 2019-06-24 MED ORDER — EPHEDRINE SULFATE 50 MG/ML IJ SOLN
INTRAMUSCULAR | Status: DC | PRN
  Administered 2019-06-24 (×3): 5 mg via INTRAVENOUS

## 2019-06-24 MED ORDER — CHLORHEXIDINE GLUCONATE 4 % EX LIQD: 60.0000 mL | Freq: Once | CUTANEOUS | Status: DC

## 2019-06-24 MED ORDER — LIDOCAINE HCL (CARDIAC) PF 100 MG/5ML IV SOSY
PREFILLED_SYRINGE | INTRAVENOUS | Status: DC | PRN
  Administered 2019-06-24: 20 mg via INTRAVENOUS

## 2019-06-24 MED ORDER — PHENYLEPHRINE HCL (PRESSORS) 10 MG/ML IV SOLN
INTRAVENOUS | Status: DC | PRN
  Administered 2019-06-24 (×2): 80 ug via INTRAVENOUS
  Administered 2019-06-24: 60 ug via INTRAVENOUS
  Administered 2019-06-24: 120 ug via INTRAVENOUS
  Administered 2019-06-24: 60 ug via INTRAVENOUS

## 2019-06-24 MED ORDER — POVIDONE-IODINE 10 % EX SWAB: 2.0000 "application " | Freq: Once | CUTANEOUS | Status: DC

## 2019-06-24 MED ORDER — ACETAMINOPHEN 325 MG PO TABS
325.0000 mg | ORAL_TABLET | Freq: Four times a day (QID) | ORAL | Status: DC | PRN
  Administered 2019-06-27: 650 mg via ORAL
  Filled 2019-06-24: qty 2

## 2019-06-24 MED ORDER — CLONAZEPAM 0.5 MG PO TABS
0.5000 mg | ORAL_TABLET | Freq: Two times a day (BID) | ORAL | Status: DC | PRN
  Administered 2019-06-24 – 2019-06-25 (×2): 0.5 mg via ORAL
  Filled 2019-06-24 (×2): qty 1

## 2019-06-24 MED ORDER — METHOCARBAMOL 500 MG PO TABS
ORAL_TABLET | ORAL | Status: AC
  Filled 2019-06-24: qty 1

## 2019-06-24 MED ORDER — TERAZOSIN HCL 5 MG PO CAPS
10.0000 mg | ORAL_CAPSULE | Freq: Every day | ORAL | Status: DC
  Administered 2019-06-24 – 2019-06-27 (×4): 10 mg via ORAL
  Filled 2019-06-24 (×5): qty 2

## 2019-06-24 MED ORDER — ASPIRIN EC 81 MG PO TBEC
81.0000 mg | DELAYED_RELEASE_TABLET | Freq: Every day | ORAL | Status: DC
  Administered 2019-06-25 – 2019-06-28 (×4): 81 mg via ORAL
  Filled 2019-06-24 (×4): qty 1

## 2019-06-24 MED ORDER — MENTHOL 3 MG MT LOZG: 1.0000 | LOZENGE | OROMUCOSAL | Status: DC | PRN

## 2019-06-24 MED ORDER — MIDAZOLAM HCL 2 MG/2ML IJ SOLN
INTRAMUSCULAR | Status: AC
  Administered 2019-06-24: 1 mg
  Filled 2019-06-24: qty 2

## 2019-06-24 MED ORDER — HYDROMORPHONE HCL 1 MG/ML IJ SOLN
0.2500 mg | INTRAMUSCULAR | Status: DC | PRN
  Administered 2019-06-24 (×2): 0.5 mg via INTRAVENOUS

## 2019-06-24 MED ORDER — 0.9 % SODIUM CHLORIDE (POUR BTL) OPTIME
TOPICAL | Status: DC | PRN
  Administered 2019-06-24: 1000 mL

## 2019-06-24 MED ORDER — ROPIVACAINE HCL 5 MG/ML IJ SOLN
INTRAMUSCULAR | Status: DC | PRN
  Administered 2019-06-24: 20 mL via PERINEURAL

## 2019-06-24 MED ORDER — INSULIN ASPART PROT & ASPART (70-30 MIX) 100 UNIT/ML ~~LOC~~ SUSP
15.0000 [IU] | Freq: Two times a day (BID) | SUBCUTANEOUS | Status: DC
  Administered 2019-06-25 – 2019-06-28 (×7): 15 [IU] via SUBCUTANEOUS
  Filled 2019-06-24: qty 10

## 2019-06-24 MED ORDER — OXYCODONE HCL 5 MG PO TABS
5.0000 mg | ORAL_TABLET | ORAL | Status: DC | PRN
  Administered 2019-06-26: 10 mg via ORAL
  Filled 2019-06-24 (×2): qty 2

## 2019-06-24 MED ORDER — DEXAMETHASONE SODIUM PHOSPHATE 10 MG/ML IJ SOLN
INTRAMUSCULAR | Status: DC | PRN
  Administered 2019-06-24: 4 mg via INTRAVENOUS

## 2019-06-24 MED ORDER — METHOCARBAMOL 500 MG PO TABS
500.0000 mg | ORAL_TABLET | Freq: Four times a day (QID) | ORAL | Status: DC | PRN
  Administered 2019-06-24 – 2019-06-28 (×9): 500 mg via ORAL
  Filled 2019-06-24 (×8): qty 1

## 2019-06-24 MED ORDER — PROPOFOL 10 MG/ML IV BOLUS
INTRAVENOUS | Status: DC | PRN
  Administered 2019-06-24: 200 mg via INTRAVENOUS

## 2019-06-24 SURGICAL SUPPLY — 66 items
BANDAGE ESMARK 6X9 LF (GAUZE/BANDAGES/DRESSINGS) ×1 IMPLANT
BLADE SAG 18X100X1.27 (BLADE) ×2 IMPLANT
BNDG ELASTIC 6X10 VLCR STRL LF (GAUZE/BANDAGES/DRESSINGS) ×2 IMPLANT
BNDG ELASTIC 6X5.8 VLCR STR LF (GAUZE/BANDAGES/DRESSINGS) IMPLANT
BNDG ESMARK 6X9 LF (GAUZE/BANDAGES/DRESSINGS) ×2
BOWL SMART MIX CTS (DISPOSABLE) ×2 IMPLANT
COVER SURGICAL LIGHT HANDLE (MISCELLANEOUS) ×2 IMPLANT
COVER WAND RF STERILE (DRAPES) IMPLANT
CUFF TOURN SGL QUICK 34 (TOURNIQUET CUFF) ×1
CUFF TOURN SGL QUICK 42 (TOURNIQUET CUFF) IMPLANT
CUFF TRNQT CYL 34X4.125X (TOURNIQUET CUFF) ×1 IMPLANT
DRAPE EXTREMITY T 121X128X90 (DISPOSABLE) ×2 IMPLANT
DRAPE HALF SHEET 40X57 (DRAPES) ×2 IMPLANT
DRAPE INCISE IOBAN 66X45 STRL (DRAPES) ×2 IMPLANT
DRAPE U-SHAPE 47X51 STRL (DRAPES) ×2 IMPLANT
DRSG PAD ABDOMINAL 8X10 ST (GAUZE/BANDAGES/DRESSINGS) ×2 IMPLANT
DURAPREP 26ML APPLICATOR (WOUND CARE) ×2 IMPLANT
ELECT CAUTERY BLADE 6.4 (BLADE) ×2 IMPLANT
ELECT REM PT RETURN 9FT ADLT (ELECTROSURGICAL) ×2
ELECTRODE REM PT RTRN 9FT ADLT (ELECTROSURGICAL) ×1 IMPLANT
FACESHIELD WRAPAROUND (MASK) ×6 IMPLANT
FEMORAL POSTERIOR SZ5 RT (Femur) ×1 IMPLANT
GAUZE SPONGE 4X4 12PLY STRL (GAUZE/BANDAGES/DRESSINGS) ×2 IMPLANT
GAUZE XEROFORM 1X8 LF (GAUZE/BANDAGES/DRESSINGS) ×2 IMPLANT
GLOVE BIOGEL PI IND STRL 8 (GLOVE) ×2 IMPLANT
GLOVE BIOGEL PI INDICATOR 8 (GLOVE) ×2
GLOVE ORTHO TXT STRL SZ7.5 (GLOVE) ×2 IMPLANT
GLOVE SURG ORTHO 8.0 STRL STRW (GLOVE) ×2 IMPLANT
GOWN STRL REUS W/ TWL LRG LVL3 (GOWN DISPOSABLE) IMPLANT
GOWN STRL REUS W/ TWL XL LVL3 (GOWN DISPOSABLE) ×2 IMPLANT
GOWN STRL REUS W/TWL LRG LVL3 (GOWN DISPOSABLE)
GOWN STRL REUS W/TWL XL LVL3 (GOWN DISPOSABLE) ×2
HANDPIECE INTERPULSE COAX TIP (DISPOSABLE) ×1
IMMOBILIZER KNEE 22 UNIV (SOFTGOODS) ×2 IMPLANT
INSERT TIB PS SZ5 11 KNEE (Miscellaneous) ×2 IMPLANT
KIT BASIN OR (CUSTOM PROCEDURE TRAY) ×2 IMPLANT
KIT TURNOVER KIT B (KITS) ×2 IMPLANT
KNEE PATELLA ASYMMETRIC 10X35 (Knees) ×2 IMPLANT
KNEE TIBIAL COMPONENT SZ5 (Knees) ×2 IMPLANT
MANIFOLD NEPTUNE II (INSTRUMENTS) ×4 IMPLANT
NEEDLE 18GX1X1/2 (RX/OR ONLY) (NEEDLE) IMPLANT
NS IRRIG 1000ML POUR BTL (IV SOLUTION) ×2 IMPLANT
PACK TOTAL JOINT (CUSTOM PROCEDURE TRAY) ×2 IMPLANT
PAD ABD 8X10 STRL (GAUZE/BANDAGES/DRESSINGS) ×2 IMPLANT
PAD ARMBOARD 7.5X6 YLW CONV (MISCELLANEOUS) ×2 IMPLANT
PADDING CAST COTTON 6X4 STRL (CAST SUPPLIES) ×2 IMPLANT
PIN FLUTED HEDLESS FIX 3.5X1/8 (PIN) ×2 IMPLANT
POSTERIOR FEMORAL SZ5 RT (Femur) ×2 IMPLANT
SET HNDPC FAN SPRY TIP SCT (DISPOSABLE) ×1 IMPLANT
SET PAD KNEE POSITIONER (MISCELLANEOUS) ×2 IMPLANT
STAPLER VISISTAT 35W (STAPLE) IMPLANT
STRIP CLOSURE SKIN 1/2X4 (GAUZE/BANDAGES/DRESSINGS) IMPLANT
SUCTION FRAZIER HANDLE 10FR (MISCELLANEOUS) ×1
SUCTION TUBE FRAZIER 10FR DISP (MISCELLANEOUS) ×1 IMPLANT
SUT MNCRL AB 4-0 PS2 18 (SUTURE) IMPLANT
SUT VIC AB 0 CT1 27 (SUTURE)
SUT VIC AB 0 CT1 27XBRD ANBCTR (SUTURE) IMPLANT
SUT VIC AB 1 CT1 27 (SUTURE) ×2
SUT VIC AB 1 CT1 27XBRD ANBCTR (SUTURE) ×2 IMPLANT
SUT VIC AB 2-0 CT1 27 (SUTURE) ×2
SUT VIC AB 2-0 CT1 TAPERPNT 27 (SUTURE) ×2 IMPLANT
SYR 50ML LL SCALE MARK (SYRINGE) IMPLANT
TOWEL GREEN STERILE (TOWEL DISPOSABLE) ×2 IMPLANT
TOWEL GREEN STERILE FF (TOWEL DISPOSABLE) ×2 IMPLANT
TRAY CATH 16FR W/PLASTIC CATH (SET/KITS/TRAYS/PACK) IMPLANT
WRAP KNEE MAXI GEL POST OP (GAUZE/BANDAGES/DRESSINGS) ×2 IMPLANT

## 2019-06-24 NOTE — Brief Op Note (Signed)
06/24/2019  1:44 PM  PATIENT:  Bryce Mejia.  73 y.o. male  PRE-OPERATIVE DIAGNOSIS:  Osteoarthritis Right Knee  POST-OPERATIVE DIAGNOSIS:  Osteoarthritis Right Knee  PROCEDURE:  Procedure(s): RIGHT TOTAL KNEE ARTHROPLASTY (Right)  SURGEON:  Surgeon(s) and Role:    Mcarthur Rossetti, MD - Primary  PHYSICIAN ASSISTANT: Benita Stabile, PA-C assisted  ANESTHESIA:   regional and general  EBL:  150 mL   COUNTS:  YES  TOURNIQUET:   Total Tourniquet Time Documented: Thigh (Right) - 69 minutes Total: Thigh (Right) - 69 minutes   DICTATION: .Other Dictation: Dictation Number [id number]  PLAN OF CARE: Admit to inpatient   PATIENT DISPOSITION:  PACU - hemodynamically stable.   Delay start of Pharmacological VTE agent (>24hrs) due to surgical blood loss or risk of bleeding: no

## 2019-06-24 NOTE — Transfer of Care (Signed)
Immediate Anesthesia Transfer of Care Note  Patient: Bryce Mejia.  Procedure(s) Performed: RIGHT TOTAL KNEE ARTHROPLASTY (Right Knee)  Patient Location: PACU  Anesthesia Type:GA combined with regional for post-op pain  Level of Consciousness: awake, alert  and oriented  Airway & Oxygen Therapy: Patient Spontanous Breathing and Patient connected to nasal cannula oxygen  Post-op Assessment: Report given to RN and Post -op Vital signs reviewed and stable  Post vital signs: Reviewed and stable  Last Vitals:  Vitals Value Taken Time  BP 134/73 06/24/19 1419  Temp    Pulse 72 06/24/19 1423  Resp 13 06/24/19 1423  SpO2 100 % 06/24/19 1423  Vitals shown include unvalidated device data.  Last Pain:  Vitals:   06/24/19 0952  TempSrc: Oral      Patients Stated Pain Goal: 3 ([id number])  Complications: No apparent anesthesia complications

## 2019-06-24 NOTE — Anesthesia Procedure Notes (Signed)
Procedure Name: LMA Insertion Date/Time: 06/24/2019 12:03 PM Performed by: Mariea Clonts, CRNA Pre-anesthesia Checklist: Patient identified, Emergency Drugs available, Suction available and Patient being monitored Patient Re-evaluated:Patient Re-evaluated prior to induction Oxygen Delivery Method: Circle System Utilized Preoxygenation: Pre-oxygenation with 100% oxygen Induction Type: IV induction Ventilation: Mask ventilation without difficulty LMA: LMA inserted LMA Size: 5.0 Number of attempts: 1 Airway Equipment and Method: Bite block Placement Confirmation: positive ETCO2 Tube secured with: Tape Dental Injury: Teeth and Oropharynx as per pre-operative assessment

## 2019-06-24 NOTE — Evaluation (Signed)
Physical Therapy Evaluation Patient Details Name: Bryce Mejia. MRN: [id number] DOB: Aug 25, 1946 Today's Date: 06/24/2019   History of Present Illness  Pt is a 73 y/o male s/p R TKA. PMH includes DM, HTN, and a flutter s/p cardioversion.   Clinical Impression  Pt is s/p surgery above with deficits below. Pt with increased nausea and only able to tolerate side stepping at EOB this session. Requiring min A for mobility tasks using RW this session. Educated about knee precautions and supine HEP. Pt reports he plans to go to SNF at d/c. Will continue to follow acutely to maximize functional mobility independence and safety.     Follow Up Recommendations Follow surgeon's recommendation for DC plan and follow-up therapies;Supervision for mobility/OOB    Equipment Recommendations  None recommended by PT    Recommendations for Other Services       Precautions / Restrictions Precautions Precautions: Knee Precaution Booklet Issued: No Precaution Comments: Reviewed knee precautions with pt  Restrictions Weight Bearing Restrictions: Yes RLE Weight Bearing: Weight bearing as tolerated      Mobility  Bed Mobility Overal bed mobility: Needs Assistance Bed Mobility: Supine to Sit;Sit to Supine     Supine to sit: Min assist Sit to supine: Min assist   General bed mobility comments: Min A for RLE management during bed mobility.   Transfers Overall transfer level: Needs assistance Equipment used: Rolling walker (2 wheeled) Transfers: Sit to/from Stand Sit to Stand: Min assist         General transfer comment: Min A for lift assist and steadying. Cues for safe hand placement. Pt reporting increased nausea in standing, so took a few side steps at EOB for repositioning and then returned to supine.   Ambulation/Gait Ambulation/Gait assistance: Min assist   Assistive device: Rolling walker (2 wheeled)       General Gait Details: Performed side steps at EOB for  repositioning. Pt with increased nausea, therefore further mobility deferred.   Stairs            Wheelchair Mobility    Modified Rankin (Stroke Patients Only)       Balance Overall balance assessment: Needs assistance Sitting-balance support: No upper extremity supported;Feet supported Sitting balance-Leahy Scale: Good     Standing balance support: Bilateral upper extremity supported;During functional activity Standing balance-Leahy Scale: Poor Standing balance comment: Reliant on BUE support                              Pertinent Vitals/Pain Pain Assessment: 0-10 Pain Score: 7  Pain Location: R knee Pain Descriptors / Indicators: Aching;Operative site guarding Pain Intervention(s): Limited activity within patient's tolerance;Monitored during session;Repositioned    Home Living Family/patient expects to be discharged to:: Skilled nursing facility Living Arrangements: Alone                    Prior Function Level of Independence: Independent               Hand Dominance        Extremity/Trunk Assessment   Upper Extremity Assessment Upper Extremity Assessment: Defer to OT evaluation    Lower Extremity Assessment Lower Extremity Assessment: RLE deficits/detail RLE Deficits / Details: Deficits consistent with post op pain and weakness.     Cervical / Trunk Assessment Cervical / Trunk Assessment: Normal  Communication   Communication: No difficulties  Cognition Arousal/Alertness: Suspect due to medications Behavior During Therapy: [facility] for tasks  assessed/performed Overall Cognitive Status: Within Functional Limits for tasks assessed                                 General Comments: Pt very groggy throughout session. Per RN pt just recieved pain medications.       General Comments      Exercises Total Joint Exercises Quad Sets: AROM;Right;10 reps;Supine Heel Slides: Right;10 reps;AAROM;Supine    Assessment/Plan    PT Assessment Patient needs continued PT services  PT Problem List Decreased strength;Decreased range of motion;Decreased activity tolerance;Decreased balance;Decreased mobility;Decreased knowledge of use of DME;Decreased knowledge of precautions;Pain       PT Treatment Interventions DME instruction;Gait training;Functional mobility training;Stair training;Therapeutic activities;Balance training;Therapeutic exercise;Patient/family education    PT Goals (Current goals can be found in the Care Plan section)  Acute Rehab PT Goals Patient Stated Goal: to go to SNF prior to return home PT Goal Formulation: With patient Time For Goal Achievement: 07/08/19 Potential to Achieve Goals: Good    Frequency 7X/week   Barriers to discharge Decreased caregiver support      Co-evaluation               AM-PAC PT "6 Clicks" Mobility  Outcome Measure Help needed turning from your back to your side while in a flat bed without using bedrails?: A Little Help needed moving from lying on your back to sitting on the side of a flat bed without using bedrails?: A Little Help needed moving to and from a bed to a chair (including a wheelchair)?: A Little Help needed standing up from a chair using your arms (e.g., wheelchair or bedside chair)?: A Little Help needed to walk in hospital room?: A Lot Help needed climbing 3-5 steps with a railing? : A Lot 6 Click Score: 16    End of Session Equipment Utilized During Treatment: Gait belt Activity Tolerance: Treatment limited secondary to medical complications (Comment)(nausea) Patient left: in bed;with call bell/phone within reach;with nursing/sitter in room Nurse Communication: Mobility status PT Visit Diagnosis: Unsteadiness on feet (R26.81);Muscle weakness (generalized) (M62.81)    Time: 1727-1750 PT Time Calculation (min) (ACUTE ONLY): 23 min   Charges:   PT Evaluation $PT Eval Low Complexity: 1 Low PT  Treatments $Therapeutic Activity: 8-22 mins        Leighton Ruff, PT, DPT  Acute Rehabilitation Services  Pager: [phone] Office: [phone]   Rudean Hitt 06/24/2019, 6:24 PM

## 2019-06-24 NOTE — Anesthesia Postprocedure Evaluation (Signed)
Anesthesia Post Note  Patient: Bryce Mejia.  Procedure(s) Performed: RIGHT TOTAL KNEE ARTHROPLASTY (Right Knee)     Patient location during evaluation: PACU Anesthesia Type: General Level of consciousness: awake and alert Pain management: pain level controlled Vital Signs Assessment: post-procedure vital signs reviewed and stable Respiratory status: spontaneous breathing, nonlabored ventilation, respiratory function stable and patient connected to nasal cannula oxygen Cardiovascular status: blood pressure returned to baseline and stable Postop Assessment: no apparent nausea or vomiting Anesthetic complications: no    Last Vitals:  Vitals:   06/24/19 1449 06/24/19 1512  BP: 133/68 (!) 157/57  Pulse: 73 64  Resp: 15 18  Temp: (!) 36.4 C (!) 36.4 C  SpO2: 97% 99%    Last Pain:  Vitals:   06/24/19 1533  TempSrc:   PainSc: 7                  Tiajuana Amass

## 2019-06-24 NOTE — Progress Notes (Signed)
Orthopedic Tech Progress Note Patient Details:  Bryce Mejia 02/26/1946 [id number]  CPM Right Knee CPM Right Knee: On Right Knee Flexion (Degrees): 90 Right Knee Extension (Degrees): 0  Post Interventions Patient Tolerated: Fair Instructions Provided: Care of device, Adjustment of device  Janit Pagan 06/24/2019, 3:13 PM

## 2019-06-24 NOTE — Anesthesia Procedure Notes (Signed)
Anesthesia Regional Block: Adductor canal block   Pre-Anesthetic Checklist: ,, timeout performed, Correct Patient, Correct Site, Correct Laterality, Correct Procedure, Correct Position, site marked, Risks and benefits discussed,  Surgical consent,  Pre-op evaluation,  At surgeon's request and post-op pain management  Laterality: Right  Prep: chloraprep       Needles:  Injection technique: Single-shot  Needle Type: Echogenic Needle     Needle Length: 9cm  Needle Gauge: 21     Additional Needles:   Procedures:,,,, ultrasound used (permanent image in chart),,,,  Narrative:  Start time: 06/24/2019 11:27 AM End time: 06/24/2019 11:33 AM Injection made incrementally with aspirations every 5 mL.  Performed by: Personally  Anesthesiologist: Suzette Battiest, MD

## 2019-06-24 NOTE — H&P (Signed)
TOTAL KNEE ADMISSION H&P  Patient is being admitted for right total knee arthroplasty.  Subjective:  Chief Complaint:right knee pain.  HPI: Bryce Mejia., 73 y.o. male, has a history of pain and functional disability in the right knee due to arthritis and has failed non-surgical conservative treatments for greater than 12 weeks to includeNSAID's and/or analgesics, corticosteriod injections, viscosupplementation injections, flexibility and strengthening excercises, use of assistive devices, weight reduction as appropriate and activity modification.  Onset of symptoms was gradual, starting 5 years ago with gradually worsening course since that time. The patient noted prior procedures on the knee to include  arthroscopy on the right knee(s).  Patient currently rates pain in the right knee(s) at 10 out of 10 with activity. Patient has night pain, worsening of pain with activity and weight bearing, pain that interferes with activities of daily living, pain with passive range of motion, crepitus and joint swelling.  Patient has evidence of subchondral sclerosis, periarticular osteophytes and joint space narrowing by imaging studies. There is no active infection.  Patient Active Problem List   Diagnosis Date Noted  . Unilateral primary osteoarthritis, right knee 03/17/2019  . Type 2 diabetes mellitus with stage 3 chronic kidney disease, with long-term current use of insulin ([place]) 01/17/2018  . DM type 2 causing vascular disease ([place]) 08/15/2017  . Type 2 diabetes mellitus with chronic kidney disease, with long-term current use of insulin ([place]) 08/15/2017  . Essential hypertension, benign 08/15/2017  . Mixed hyperlipidemia 08/15/2017  . Hypothyroidism 08/15/2017  . Morbid obesity ([place]) 08/15/2017   Past Medical History:  Diagnosis Date  . Chronic kidney disease, stage 3 (HCC)    CKD stage 3  . Diabetes mellitus, type II ([place])   . Family history of adverse reaction to anesthesia    Mom-  difficult to wake up from anesthesia  . H/O atrial flutter    s/p DCCV 08/01/17  . Heart attack ([place])   . Sleep apnea     Past Surgical History:  Procedure Laterality Date  . ABLATION SAPHENOUS VEIN W/ RFA     Saphenous vein endovascular radiofrequency ablation: right smaller saphenous vein 03/13/16 and left small spahenous vein 03/27/16; left GSV RFA 01/08/15, right GSA RFA 01/22/15 ([place])  . CARDIAC CATHETERIZATION     04/17/14 ([place]): normal LM, 50% mid LAD, multiple 40-50% distal LAD lesions, occluded mid CX, s/p PROMUS stent, 80% ostial AV branch, long 50% stenosis proximal RCA-mid RCA, 25-30% PDA. Medical tx for LAD and RCA disease Cleora Fleet, MD)  . CARPAL TUNNEL RELEASE    . NOSE SURGERY    . REPLACEMENT TOTAL KNEE      Current Facility-Administered Medications  Medication Dose Route Frequency Provider Last Rate Last Dose  . 0.9 %  sodium chloride infusion   Intravenous Continuous Suzette Battiest, MD 10 mL/hr at 06/24/19 1031    . ceFAZolin (ANCEF) 2-4 GM/100ML-% IVPB           . ceFAZolin (ANCEF) IVPB 2g/100 mL premix  2 g Intravenous On Call to OR Pete Pelt, PA-C      . chlorhexidine (HIBICLENS) 4 % liquid 4 application  60 mL Topical Once Erskine Emery W, PA-C      . povidone-iodine 10 % swab 2 application  2 application Topical Once Pete Pelt, PA-C      . tranexamic acid (CYKLOKAPRON) IVPB 1,000 mg  1,000 mg Intravenous To OR Mcarthur Rossetti, MD       Facility-Administered Medications Ordered  in Other Encounters  Medication Dose Route Frequency Provider Last Rate Last Dose  . ropivacaine (PF) 5 mg/mL (0.5%) (NAROPIN) injection    Anesthesia Intra-op Suzette Battiest, MD   20 mL at 06/24/19 1133   No Known Allergies  Social History   Tobacco Use  . Smoking status: Former Smoker    Quit date: 08/16/2007    Years since quitting: 11.8  . Smokeless tobacco: Never Used  Substance Use Topics  . Alcohol use: Yes    Comment:  Occassional    No family history on file.   Review of Systems  Musculoskeletal: Positive for joint pain.  All other systems reviewed and are negative.   Objective:  Physical Exam  Constitutional: He is oriented to person, place, and time. He appears well-developed and well-nourished.  HENT:  Head: Normocephalic and atraumatic.  Eyes: Pupils are equal, round, and reactive to light.  Neck: Normal range of motion. Neck supple.  Cardiovascular: Normal rate.  Respiratory: Effort normal.  GI: Soft.  Musculoskeletal:     Right knee: He exhibits decreased range of motion, swelling, effusion, abnormal alignment and abnormal meniscus. Tenderness found. Medial joint line and lateral joint line tenderness noted.  Neurological: He is alert and oriented to person, place, and time.  Skin: Skin is warm and dry.  Psychiatric: He has a normal mood and affect.    Vital signs in last 24 hours: Temp:  [97.7 F (36.5 C)] 97.7 F (36.5 C) (08/11 0952) Pulse Rate:  [54-58] 54 (08/11 1100) Resp:  [20] 20 (08/11 0952) BP: (140-150)/(59-65) 140/59 (08/11 1100) SpO2:  [98 %] 98 % (08/11 0952)  Labs:   Estimated body mass index is 38.25 kg/m as calculated from the following:   Height as of 06/20/19: 5' 8.5" (1.74 m).   Weight as of 06/20/19: 115.8 kg.   Imaging Review Plain radiographs demonstrate severe degenerative joint disease of the right knee(s). The overall alignment ismild varus. The bone quality appears to be good for age and reported activity level.      Assessment/Plan:  End stage arthritis, right knee   The patient history, physical examination, clinical judgment of the provider and imaging studies are consistent with end stage degenerative joint disease of the right knee(s) and total knee arthroplasty is deemed medically necessary. The treatment options including medical management, injection therapy arthroscopy and arthroplasty were discussed at length. The risks and benefits of  total knee arthroplasty were presented and reviewed. The risks due to aseptic loosening, infection, stiffness, patella tracking problems, thromboembolic complications and other imponderables were discussed. The patient acknowledged the explanation, agreed to proceed with the plan and consent was signed. Patient is being admitted for inpatient treatment for surgery, pain control, PT, OT, prophylactic antibiotics, VTE prophylaxis, progressive ambulation and ADL's and discharge planning. The patient is planning to be discharged to skilled nursing facility    Anticipated LOS equal to or greater than 2 midnights due to - Age 31 and older with one or more of the following:  - Obesity  - Expected need for hospital services (PT, OT, Nursing) required for safe  discharge  - Anticipated need for postoperative skilled nursing care or inpatient rehab  - Active co-morbidities: Coronary Artery Disease OR   - Unanticipated findings during/Post Surgery: None  - Patient is a high risk of re-admission due to: None

## 2019-06-24 NOTE — Op Note (Signed)
NAME: Renn JR., [place] [id number] ACCOUNT [id number] DATE OF BIRTH:Apr 03, 1946 FACILITY: [facility] LOCATION: [facility] PHYSICIAN:Lamyia Cdebaca Kerry Fort, MD  OPERATIVE REPORT  DATE OF PROCEDURE:  06/24/2019  PREOPERATIVE DIAGNOSIS:  Primary osteoarthritis and degenerative joint disease, right knee.  POSTOPERATIVE DIAGNOSIS:  Primary osteoarthritis and degenerative joint disease, right knee.  PROCEDURE:  Right total knee arthroplasty.  IMPLANTS:  Stryker Triathlon press-fit knee system with size 5 femur, size 5 tibial tray, 11 mm fixed bearing polyethylene insert, size 35 patellar button.  SURGEON:  Lind Guest. Ninfa Linden, MD  ASSISTANT:  Erskine Emery, PA-C  ANESTHESIA:   1.  Right lower extremity adductor canal block. 2.  General.  TOURNIQUET TIME:  Under 1.5 hours.  ESTIMATED BLOOD LOSS:  Less than 100 mL.  COMPLICATIONS:  None.  INDICATIONS:  The patient is a 73 year old gentleman with debilitating arthritis involving his right knee.  He has been dealing with this for over a decade now.  He has had arthroscopic intervention of that knee remotely in the past.  He does have a  history of a previous left total knee arthroplasty done.  At this point, his right knee pain is daily and it has detrimentally affected his mobility, his activities of daily living, and his quality of life.  It is to the point that he does wish to  proceed with total knee arthroplasty.  He is on Eliquis as a blood thinner knows he needs to stop this before surgery.  It was stopped 2-3 days ago.  Given that timeframe, he still only qualifies for general anesthesia and that spinal anesthesia.  He  understands this and does wish to proceed.  He has been seen by his cardiologist in [place], [place], and has been cleared for surgery as well.  Having had this done before he is fully aware of the risk of acute blood loss anemia, nerve or vessel  injury, fracture, infection, implant failure  and DVT.  He understands our goals are to decrease pain, improve mobility and overall improve quality of life.  DESCRIPTION OF PROCEDURE:  After informed consent was obtained and appropriate right knee was marked, an adductor canal block was obtained in the holding room.  He was brought to the operating room and placed supine on the operating table.  General  anesthesia was then obtained.  A nonsterile tourniquet was placed on his upper right thigh and his right thigh, knee, leg, ankle, foot were prepped and draped with DuraPrep and sterile drapes including a sterile stockinette.  Time-out was called.  He was  identified as correct patient, correct right knee.  We then used an Esmarch to wrap that leg and tourniquet was inflated to 300 mm of pressure.  We then made a direct midline incision over the patella and carried this proximally and distally.  We  dissected down the knee joint carried out a medial parapatellar arthrotomy finding significant tricompartmental arthritis in his knee and a large joint effusion.  With the knee in a flexed position, we removed remnants of ACL, PCL, medial and lateral  meniscus.  We set our extramedullary cutting guide for making this for a neutral slope and correcting for varus and valgus.  We made this to also take 9 mm off the high side.  We made this cut without difficulty.  We then went to the femur and used an  intramedullary guide for distal femoral cut setting this for 5 degrees externally rotated for a 10 mm distal femoral cut.  We made  this cut without difficulty and brought the knee back down to full extension with a 9 mm extension block had achieved full  extension.  We then went back to the femur and put our femoral sizing guide based off the epicondylar axis and chose a size 5 femur for this.  We placed our 4-in-1 cutting block for size 5 femur, made our anterior and posterior cuts, followed by our  chamfer cuts.  We then made our femoral box cut.  Attention was  then turned back to the tibia.  We trialed a size 5 tibial tray for coverage and we appreciated the coverage of this setting, the rotation off the tibial tubercle and the femur.  We made our  keel punch off of this.  We did feel like he had a nice solid bone and so we felt that the press-fit components were appropriate.  With the size 5 trial tibia, we trialed the size 5 right femur and placed and a 9 mm trial polyethylene insert.  We then  made our patellar cut and drilled 3 holes for a size 35 patellar button.  With all trial components in place, we were pleased with the range of motion and stability but we did feel like we would need a little bit thicker polyethylene liner.  We removed  all trial components from the knee and then irrigated the knee with normal saline solution using pulsatile lavage.  We then dried the knee very well and opened up our implants.  We then placed our real Stryker press-fit tibial tray size 5 followed by  real size 5 press-fit right femur.  We placed our 11 mm fixed bearing polyethylene insert and press-fit our size 35 patellar button.  Again, I put the knee through range of motion.  I felt it was stable.  We then let the tourniquet down.  Hemostasis  obtained with electrocautery.  We closed the arthrotomy with interrupted #1 Vicryl suture followed by closing the deep tissue was 0 Vicryl, then closed the subcutaneous tissue with 2-0 Vicryl.  Interrupted staples were used to close the skin.  A  well-padded sterile dressing was applied.  The patient was awakened, extubated, and taken to recovery room in stable condition.  All counts at final counts were correct.  There were no complications noted.  Of note, Benita Stabile, PA-C, assisted in the  entire case.  His assistance was crucial for facilitating all aspects of this case.  TN/NUANCE  D:06/24/2019 T:06/24/2019 JOB:[id number]

## 2019-06-25 ENCOUNTER — Encounter (HOSPITAL_COMMUNITY): Payer: Self-pay | Admitting: General Practice

## 2019-06-25 LAB — CBC
HCT: 31.9 % — ABNORMAL LOW (ref 39.0–52.0)
Hemoglobin: 10.5 g/dL — ABNORMAL LOW (ref 13.0–17.0)
MCH: 31.8 pg (ref 26.0–34.0)
MCHC: 32.9 g/dL (ref 30.0–36.0)
MCV: 96.7 fL (ref 80.0–100.0)
Platelets: 122 10*3/uL — ABNORMAL LOW (ref 150–400)
RBC: 3.3 MIL/uL — ABNORMAL LOW (ref 4.22–5.81)
RDW: 13.5 % (ref 11.5–15.5)
WBC: 9 10*3/uL (ref 4.0–10.5)
nRBC: 0 % (ref 0.0–0.2)

## 2019-06-25 LAB — GLUCOSE, CAPILLARY
Glucose-Capillary: 132 mg/dL — ABNORMAL HIGH (ref 70–99)
Glucose-Capillary: 114 mg/dL — ABNORMAL HIGH (ref 70–99)
Glucose-Capillary: 126 mg/dL — ABNORMAL HIGH (ref 70–99)
Glucose-Capillary: 139 mg/dL — ABNORMAL HIGH (ref 70–99)

## 2019-06-25 LAB — BASIC METABOLIC PANEL
Anion gap: 10 (ref 5–15)
BUN: 40 mg/dL — ABNORMAL HIGH (ref 8–23)
CO2: 20 mmol/L — ABNORMAL LOW (ref 22–32)
Calcium: 8.2 mg/dL — ABNORMAL LOW (ref 8.9–10.3)
Chloride: 106 mmol/L (ref 98–111)
Creatinine, Ser: 2.14 mg/dL — ABNORMAL HIGH (ref 0.61–1.24)
GFR calc Af Amer: 34 mL/min — ABNORMAL LOW (ref >60)
GFR calc non Af Amer: 30 mL/min — ABNORMAL LOW (ref >60)
Glucose, Bld: 173 mg/dL — ABNORMAL HIGH (ref 70–99)
Potassium: 5.1 mmol/L (ref 3.5–5.1)
Sodium: 136 mmol/L (ref 135–145)

## 2019-06-25 NOTE — Progress Notes (Signed)
OT Cancellation Note  Patient Details Name: Bryce Mejia. MRN: [id number] DOB: 07/06/1946   Cancelled Treatment:    Reason Eval/Treat Not Completed: Fatigue/lethargy limiting ability to participate;Pain limiting ability to participate. RN notified. Will try back next appropriate/available time  Britt Bottom 06/25/2019, 3:16 PM

## 2019-06-25 NOTE — Plan of Care (Signed)
  Problem: Activity: Goal: Risk for activity intolerance will decrease Outcome: Progressing   Problem: Coping: Goal: Level of anxiety will decrease Outcome: Progressing   Problem: Pain Managment: Goal: General experience of comfort will improve Outcome: Progressing   Problem: Safety: Goal: Ability to remain free from injury will improve Outcome: Progressing   Problem: Skin Integrity: Goal: Risk for impaired skin integrity will decrease Outcome: Progressing   

## 2019-06-25 NOTE — Progress Notes (Signed)
Subjective: 1 Day Post-Op Procedure(s) (LRB): RIGHT TOTAL KNEE ARTHROPLASTY (Right) Patient reports pain as moderate.  Currently in his CPM  Objective: Vital signs in last 24 hours: Temp:  [97.3 F (36.3 C)-98.3 F (36.8 C)] 98.3 F (36.8 C) (08/12 0355) Pulse Rate:  [54-89] 75 (08/12 0355) Resp:  [10-20] 16 (08/12 0355) BP: (133-157)/(57-73) 137/61 (08/12 0355) SpO2:  [92 %-100 %] 97 % (08/12 0355)  Intake/Output from previous day: 08/11 0701 - 08/12 0700 In: 2180 [P.O.:480; I.V.:1700] Out: 150 [Blood:150] Intake/Output this shift: No intake/output data recorded.  Recent Labs    06/25/19 0344  HGB 10.5*   Recent Labs    06/25/19 0344  WBC 9.0  RBC 3.30*  HCT 31.9*  PLT 122*   Recent Labs    06/25/19 0344  NA 136  K 5.1  CL 106  CO2 20*  BUN 40*  CREATININE 2.14*  GLUCOSE 173*  CALCIUM 8.2*   No results for input(s): LABPT, INR in the last 72 hours.  Sensation intact distally Intact pulses distally Dorsiflexion/Plantar flexion intact Incision: dressing C/D/I   Assessment/Plan: 1 Day Post-Op Procedure(s) (LRB): RIGHT TOTAL KNEE ARTHROPLASTY (Right) Up with therapy Discharge to SNF over the next few days. Social Work consult for SNF placement.      Mcarthur Rossetti 06/25/2019, 7:37 AM

## 2019-06-25 NOTE — Progress Notes (Signed)
Set up patient's home CPAP machine and added bleed in for O2.

## 2019-06-25 NOTE — Plan of Care (Signed)
  Problem: Activity: Goal: Ability to avoid complications of mobility impairment will improve Outcome: Progressing   Problem: Pain Management: Goal: Pain level will decrease with appropriate interventions Outcome: Progressing   Problem: Skin Integrity: Goal: Will show signs of wound healing Outcome: Progressing   

## 2019-06-26 LAB — GLUCOSE, CAPILLARY
Glucose-Capillary: 124 mg/dL — ABNORMAL HIGH (ref 70–99)
Glucose-Capillary: 126 mg/dL — ABNORMAL HIGH (ref 70–99)
Glucose-Capillary: 143 mg/dL — ABNORMAL HIGH (ref 70–99)

## 2019-06-26 MED ORDER — METHOCARBAMOL 500 MG PO TABS: 500.0000 mg | ORAL_TABLET | Freq: Four times a day (QID) | ORAL | 0 refills | Status: DC | PRN

## 2019-06-26 MED ORDER — OXYCODONE HCL 5 MG PO TABS: 5.0000 mg | ORAL_TABLET | ORAL | 0 refills | Status: DC | PRN

## 2019-06-26 NOTE — Progress Notes (Signed)
Patient ID: Bryce Mejia., male   DOB: 1945-11-26, 73 y.o.   MRN: [id number] Doing well overall.  Right knee stable.  The patient will need discharge to short-term skilled nursing following this hospitalization.  A consult has been put in to Social Work early yesterday morning, but there are no notes from SW.  The patient will be ready for discharge by tomorrow unless there are delays from a Social Work disposition standpoint.

## 2019-06-26 NOTE — Care Management (Addendum)
Will be covering 8/14, received a call regarding discharge.   See previous case managers note. Spoke to patient his first choice is [facility] , faxed referral to [facility].   Second choice is [facility] , spoke to [place] [id number] fax [phone] , faxed referral.   Due to covid restrictions SNF requiring patient to be transported by ambulance service not family or friends. [facility] is within [place] 69 mile radius , however, [facility] is over , so patient would have to pay upfront for transport. Patient aware.  Also faxed patient to other SNFS close to his home. Will follow up with SNF's and patient in morning.  Patient will need covid test within 24 hours of discharge. Last was 8/7.   Patient aware of all of above and voiced understanding.  Magdalen Spatz RN [phone]

## 2019-06-26 NOTE — NC FL2 (Signed)
[place] LEVEL OF CARE SCREENING TOOL     IDENTIFICATION  Patient Name: Bryce Mejia. Birthdate: [birth year] Sex: male Admission Date (Current Location): 06/24/2019  [facility] and [place] Number:  Herbalist and Address:         Provider Number:    Attending Physician Name and Address:  Mcarthur Rossetti  Relative Name and Phone Number:  Son: Carnel Tornes [phone]    Current Level of Care: Hospital Recommended Level of Care: [place] Prior Approval Number:    Date Approved/Denied:   PASRR Number:    Discharge Plan: SNF    Current Diagnoses: Patient Active Problem List   Diagnosis Date Noted  . Status post total right knee replacement 06/24/2019  . Unilateral primary osteoarthritis, right knee 03/17/2019  . Type 2 diabetes mellitus with stage 3 chronic kidney disease, with long-term current use of insulin ([place]) 01/17/2018  . DM type 2 causing vascular disease ([place]) 08/15/2017  . Type 2 diabetes mellitus with chronic kidney disease, with long-term current use of insulin ([place]) 08/15/2017  . Essential hypertension, benign 08/15/2017  . Mixed hyperlipidemia 08/15/2017  . Hypothyroidism 08/15/2017  . Morbid obesity ([place]) 08/15/2017    Orientation RESPIRATION BLADDER Height & Weight     Self, Time, Situation, Place  Normal Continent Weight: 115 kg Height:  5' 8.5" (174 cm)  BEHAVIORAL SYMPTOMS/MOOD NEUROLOGICAL BOWEL NUTRITION STATUS      Continent Diet  AMBULATORY STATUS COMMUNICATION OF NEEDS Skin   Limited Assist Verbally Surgical wounds(right tiotal knee arthroplasty)                       Personal Care Assistance Level of Assistance  Bathing, Dressing Bathing Assistance: Limited assistance   Dressing Assistance: Maximum assistance(max assist for lower extremities)     Functional Limitations Info             SPECIAL CARE FACTORS FREQUENCY  PT (By licensed PT), OT (By licensed OT)      PT Frequency: 5x/week OT Frequency: min 3x/week            Contractures Contractures Info: Not present    Additional Factors Info  Code Status Code Status Info: full code             Current Medications (06/26/2019):  This is the current hospital active medication list Current Facility-Administered Medications  Medication Dose Route Frequency Provider Last Rate Last Dose  . 0.9 %  sodium chloride infusion   Intravenous Continuous Mcarthur Rossetti, MD 75 mL/hr at 06/25/19 1959    . acetaminophen (TYLENOL) tablet 325-650 mg  325-650 mg Oral Q6H PRN Mcarthur Rossetti, MD      . alum & mag hydroxide-simeth (MAALOX/MYLANTA) 200-200-20 MG/5ML suspension 30 mL  30 mL Oral Q4H PRN Mcarthur Rossetti, MD      . apixaban Arne [place]) tablet 5 mg  5 mg Oral BID Mcarthur Rossetti, MD   5 mg at 06/26/19 [id number]  . aspirin EC tablet 81 mg  81 mg Oral Daily Mcarthur Rossetti, MD   81 mg at 06/26/19 [id number]  . cholecalciferol (VITAMIN D3) tablet 2,000 Units  2,000 Units Oral Daily Mcarthur Rossetti, MD   2,000 Units at 06/26/19 [id number]  . clonazePAM (KLONOPIN) tablet 0.5 mg  0.5 mg Oral BID PRN Mcarthur Rossetti, MD   0.5 mg at 06/25/19 2000  . diphenhydrAMINE (BENADRYL) 12.5 MG/5ML elixir 12.5-25 mg  12.5-25 mg  Oral Q4H PRN Mcarthur Rossetti, MD      . docusate sodium (COLACE) capsule 100 mg  100 mg Oral BID Mcarthur Rossetti, MD   100 mg at 06/26/19 [id number]  . finasteride (PROSCAR) tablet 5 mg  5 mg Oral Daily Mcarthur Rossetti, MD   5 mg at 06/26/19 [id number]  . furosemide (LASIX) tablet 20 mg  20 mg Oral Angelica Chessman, MD   20 mg at 06/25/19 0830  . HYDROmorphone (DILAUDID) injection 0.5-1 mg  0.5-1 mg Intravenous Q2H PRN Mcarthur Rossetti, MD   1 mg at 06/25/19 2001  . insulin aspart protamine- aspart (NOVOLOG MIX 70/30) injection 15 Units  15 Units Subcutaneous BID WC Mcarthur Rossetti, MD   15 Units at 06/26/19 [phone]  .  levothyroxine (SYNTHROID) tablet 175 mcg  175 mcg Oral Q0600 Mcarthur Rossetti, MD   175 mcg at 06/26/19 0557  . lisinopril (ZESTRIL) tablet 10 mg  10 mg Oral Daily Mcarthur Rossetti, MD   10 mg at 06/26/19 [id number]  . menthol-cetylpyridinium (CEPACOL) lozenge 3 mg  1 lozenge Oral PRN Mcarthur Rossetti, MD       Or  . phenol (CHLORASEPTIC) mouth spray 1 spray  1 spray Mouth/Throat PRN Mcarthur Rossetti, MD      . methocarbamol (ROBAXIN) tablet 500 mg  500 mg Oral Q6H PRN Mcarthur Rossetti, MD   500 mg at 06/26/19 1038   Or  . methocarbamol (ROBAXIN) 500 mg in dextrose 5 % 50 mL IVPB  500 mg Intravenous Q6H PRN Mcarthur Rossetti, MD      . metoCLOPramide (REGLAN) tablet 5-10 mg  5-10 mg Oral Q8H PRN Mcarthur Rossetti, MD       Or  . metoCLOPramide (REGLAN) injection 5-10 mg  5-10 mg Intravenous Q8H PRN Mcarthur Rossetti, MD      . montelukast (SINGULAIR) tablet 10 mg  10 mg Oral QHS Mcarthur Rossetti, MD   10 mg at 06/25/19 2302  . ondansetron (ZOFRAN) tablet 4 mg  4 mg Oral Q6H PRN Mcarthur Rossetti, MD       Or  . ondansetron [facility]) injection 4 mg  4 mg Intravenous Q6H PRN Mcarthur Rossetti, MD   4 mg at 06/25/19 0018  . oxyCODONE (Oxy IR/ROXICODONE) immediate release tablet 10-15 mg  10-15 mg Oral Q4H PRN Mcarthur Rossetti, MD   15 mg at 06/26/19 1038  . oxyCODONE (Oxy IR/ROXICODONE) immediate release tablet 5-10 mg  5-10 mg Oral Q4H PRN Mcarthur Rossetti, MD      . pantoprazole (PROTONIX) EC tablet 40 mg  40 mg Oral Daily Mcarthur Rossetti, MD   40 mg at 06/26/19 [id number]  . polyethylene glycol (MIRALAX / GLYCOLAX) packet 17 g  17 g Oral Daily PRN Mcarthur Rossetti, MD      . pravastatin (PRAVACHOL) tablet 20 mg  20 mg Oral QHS Mcarthur Rossetti, MD   20 mg at 06/25/19 2301  . terazosin (HYTRIN) capsule 10 mg  10 mg Oral QHS Mcarthur Rossetti, MD   10 mg at 06/25/19 2301  . zolpidem (AMBIEN) tablet  5 mg  5 mg Oral QHS PRN Mcarthur Rossetti, MD         Discharge Medications: Please see discharge summary for a list of discharge medications.  Relevant Imaging Results:  Relevant Lab Results:   Additional Information SS# [id number]  Ninfa Meeker, RN

## 2019-06-26 NOTE — TOC Initial Note (Signed)
Transition of Care (TOC) - Initial/Assessment Note    Patient Details  Name: Bryce Mejia. MRN: [id number] Date of Birth: May 14, 1946  Transition of Care [facility]) CM/SW Contact:    Ninfa Meeker, RN Phone Number: [phone] ( working remotely) 06/26/2019, 3:24 PM  Clinical Narrative:   73 yr old gentleman s/p right total knee arthroplasty. Case manager spoke with patient via telephone concerning discharge plan. Patient says he has two facilities in mind for rehab: [facility] in [place], this is his first choice and [facility] in [place], [place]. Case manager has completed FL2, will fax out when signed by MD. Patient requests follow up call on his cell phone [phone].   Expected Discharge Plan: Skilled Nursing Facility Barriers to Discharge: No Barriers Identified   Patient Goals and CMS Choice Patient states their goals for this hospitalization and ongoing recovery are:: to get better CMS Medicare.gov Compare Post Acute Care list provided to:: Patient Choice offered to / list presented to : Patient  Expected Discharge Plan and Services Expected Discharge Plan: [place]   Discharge Planning Services: CM Consult Post Acute Care Choice: [place] Living arrangements for the past 2 months: Single Family Home                 DME Arranged: N/A         HH Arranged: NA          Prior Living Arrangements/Services Living arrangements for the past 2 months: Single Family Home Lives with:: Self Patient language and need for interpreter reviewed:: No Do you feel safe going back to the place where you live?: Yes      Need for Family Participation in Patient Care: Yes (Comment) Care giver support system in place?: Yes (comment)   Criminal Activity/Legal Involvement Pertinent to Current Situation/Hospitalization: No - Comment as needed  Activities of Daily Living Home Assistive Devices/Equipment: Eyeglasses ADL Screening  (condition at time of admission) Patient's cognitive ability adequate to safely complete daily activities?: Yes Is the patient deaf or have difficulty hearing?: No Does the patient have difficulty seeing, even when wearing glasses/contacts?: No Does the patient have difficulty concentrating, remembering, or making decisions?: No Patient able to express need for assistance with ADLs?: Yes Does the patient have difficulty dressing or bathing?: No Independently performs ADLs?: Yes (appropriate for developmental age) Does the patient have difficulty walking or climbing stairs?: Yes Weakness of Legs: Right Weakness of Arms/Hands: None  Permission Sought/Granted Permission sought to share information with : Case Manager Permission granted to share information with : Yes, Release of Information Signed     Permission granted to share info w AGENCY: SNF's        Emotional Assessment   Attitude/Demeanor/Rapport: Gracious   Orientation: : Oriented to Self, Oriented to Place, Oriented to  Time, Oriented to Situation Alcohol / Substance Use: Not Applicable    Admission diagnosis:  Osteoarthritis Right Knee Patient Active Problem List   Diagnosis Date Noted  . Status post total right knee replacement 06/24/2019  . Unilateral primary osteoarthritis, right knee 03/17/2019  . Type 2 diabetes mellitus with stage 3 chronic kidney disease, with long-term current use of insulin ([place]) 01/17/2018  . DM type 2 causing vascular disease ([place]) 08/15/2017  . Type 2 diabetes mellitus with chronic kidney disease, with long-term current use of insulin ([place]) 08/15/2017  . Essential hypertension, benign 08/15/2017  . Mixed hyperlipidemia 08/15/2017  . Hypothyroidism 08/15/2017  . Morbid obesity ([place]) 08/15/2017  PCP:  Roderic Scarce, MD Pharmacy:   [place], [place] [address] [place] [address] Phone: [phone] Fax:  [phone]     Social Determinants of Health (SDOH) Interventions    Readmission Risk Interventions No flowsheet data found.

## 2019-06-26 NOTE — Evaluation (Signed)
Occupational Therapy Evaluation Patient Details Name: Bryce Mejia. MRN: [id number] DOB: [birth year] Today's Date: 06/26/2019    History of Present Illness Pt is a 73 y/o male s/p R TKA. PMH includes DM, HTN, and a flutter s/p cardioversion.    Clinical Impression   Pt with decline in function and safety with ADLs and ADL mobility and impaired balance, strength and endurance. Pt lives at home alone and plans to d/c to a SNF for rehab after acute d/c. Pt reports that he was independent with ADLs, selfcare and mobility PTA. Pt currently requires max A with LB ADLs and min - min guard A with mobility using RW. Pt would benefit from acute OT services to maximize level of function and safety    Follow Up Recommendations  SNF    Equipment Recommendations  Other (comment)(TBD at SNF)    Recommendations for Other Services       Precautions / Restrictions Precautions Precautions: Knee Precaution Comments: Reviewed knee precautions with pt  Restrictions Weight Bearing Restrictions: No RLE Weight Bearing: Weight bearing as tolerated      Mobility Bed Mobility               General bed mobility comments: pt in recliner upon arrival  Transfers Overall transfer level: Needs assistance Equipment used: Rolling walker (2 wheeled) Transfers: Sit to/from Stand Sit to Stand: Min assist;Min guard         General transfer comment: slow, guarded movement. Sit - stand from recliner and toilet    Balance Overall balance assessment: Needs assistance Sitting-balance support: No upper extremity supported;Feet supported Sitting balance-Leahy Scale: Good     Standing balance support: Bilateral upper extremity supported;During functional activity Standing balance-Leahy Scale: Poor                             ADL either performed or assessed with clinical judgement   ADL Overall ADL's : Needs assistance/impaired Eating/Feeding: Independent;Sitting   Grooming:  Wash/dry hands;Wash/dry face;Min guard;Standing   Upper Body Bathing: Supervision/ safety;Set up;Sitting   Lower Body Bathing: Maximal assistance   Upper Body Dressing : Set up;Supervision/safety   Lower Body Dressing: Maximal assistance   Toilet Transfer: Minimal assistance;Min guard;Ambulation;RW;Cueing for safety;Cueing for sequencing;Comfort height toilet;Grab bars   Toileting- Clothing Manipulation and Hygiene: Moderate assistance;Sit to/from stand       Functional mobility during ADLs: Minimal assistance;Min guard;Cueing for sequencing;Rolling walker;Cueing for safety       Vision Baseline Vision/History: Wears glasses Wears Glasses: At all times Patient Visual Report: No change from baseline       Perception     Praxis      Pertinent Vitals/Pain Pain Assessment: 0-10 Pain Score: 8  Pain Location: R knee Pain Descriptors / Indicators: Aching;Operative site guarding Pain Intervention(s): Limited activity within patient's tolerance;Monitored during session;Premedicated before session;Repositioned     Hand Dominance Right   Extremity/Trunk Assessment Upper Extremity Assessment Upper Extremity Assessment: Overall WFL for tasks assessed   Lower Extremity Assessment Lower Extremity Assessment: Defer to PT evaluation   Cervical / Trunk Assessment Cervical / Trunk Assessment: Normal   Communication Communication Communication: No difficulties   Cognition Arousal/Alertness: Awake/alert Behavior During Therapy: WFL for tasks assessed/performed Overall Cognitive Status: Within Functional Limits for tasks assessed  General Comments       Exercises     Shoulder Instructions      Home Living Family/patient expects to be discharged to:: Skilled nursing facility Living Arrangements: Alone                                      Prior Functioning/Environment Level of Independence: Independent                  OT Problem List: Impaired balance (sitting and/or standing);Pain;Decreased activity tolerance;Decreased knowledge of use of DME or AE      OT Treatment/Interventions: Self-care/ADL training;DME and/or AE instruction;Therapeutic activities;Patient/family education    OT Goals(Current goals can be found in the care plan section) Acute Rehab OT Goals Patient Stated Goal: to go to SNF prior to return home OT Goal Formulation: With patient Time For Goal Achievement: 07/03/19 Potential to Achieve Goals: Good ADL Goals Pt Will Perform Grooming: with supervision;with set-up;standing Pt Will Perform Upper Body Bathing: with set-up;sitting Pt Will Perform Lower Body Bathing: with mod assist;sitting/lateral leans;sit to/from stand Pt Will Perform Upper Body Dressing: with set-up;sitting Pt Will Perform Lower Body Dressing: with mod assist;sitting/lateral leans;sit to/from stand Pt Will Transfer to Toilet: with min guard assist;with supervision;ambulating Pt Will Perform Toileting - Clothing Manipulation and hygiene: with min assist;sit to/from stand  OT Frequency: Min 2X/week   Barriers to D/C: Decreased caregiver support  pt lives at home alone       Co-evaluation              AM-PAC OT "6 Clicks" Daily Activity     Outcome Measure Help from another person eating meals?: None Help from another person taking care of personal grooming?: A Little Help from another person toileting, which includes using toliet, bedpan, or urinal?: A Lot Help from another person bathing (including washing, rinsing, drying)?: A Lot Help from another person to put on and taking off regular upper body clothing?: A Little Help from another person to put on and taking off regular lower body clothing?: A Lot 6 Click Score: 16   End of Session Equipment Utilized During Treatment: Gait belt;Rolling walker;Other (comment)(3 in 1) CPM Right Knee CPM Right Knee: Off  Activity Tolerance:  Patient limited by fatigue Patient left: Other (comment)(on toilet with NT)  OT Visit Diagnosis: Unsteadiness on feet (R26.81);Other abnormalities of gait and mobility (R26.89);Pain Pain - Right/Left: Right Pain - part of body: Knee                Time: [id number] OT Time Calculation (min): 34 min Charges:  OT General Charges $OT Visit: 1 Visit OT Evaluation $OT Eval Moderate Complexity: 1 Mod OT Treatments $Self Care/Home Management : 8-22 mins    Britt Bottom 06/26/2019, 1:54 PM

## 2019-06-26 NOTE — Progress Notes (Signed)
Physical Therapy Treatment Patient Details Name: Bryce Mejia. MRN: [id number] DOB: Sep 19, 1946 Today's Date: 06/26/2019    History of Present Illness Pt is a 73 y/o male s/p R TKA. PMH includes DM, HTN, and a flutter s/p cardioversion.     PT Comments    Pt performed gt training progression.  He is slow and guarded during session and remains limited due to fatigue and pain.  Pt is progressing but lacks support at home.  He continues to require min assistance for all aspects of mobility.  Plan for SNF remains appropriate at this time. Will f/u for progression of mobility per POC. Unable to perform therapeutic exercises and R LE incision ( distally) began to pocket blood and started to ooze out of aqualcel bandage.   Follow Up Recommendations  SNF     Equipment Recommendations  None recommended by PT    Recommendations for Other Services       Precautions / Restrictions Precautions Precautions: Knee Precaution Booklet Issued: No Precaution Comments: Reviewed knee precautions with pt  Restrictions Weight Bearing Restrictions: No RLE Weight Bearing: Weight bearing as tolerated    Mobility  Bed Mobility Overal bed mobility: Needs Assistance Bed Mobility: Supine to Sit;Sit to Supine     Supine to sit: Min assist Sit to supine: Min assist   General bed mobility comments: Pt required min assistance to advance RLE to edge of bed and elevate trunk into seated position.  Pt required increased time and effort.  To return to bed patient required min assistance to lift RLE back to bed.  Transfers Overall transfer level: Needs assistance Equipment used: Rolling walker (2 wheeled) Transfers: Sit to/from Stand Sit to Stand: Min assist;From elevated surface         General transfer comment: Cues for hand placement and RLE advancement to edge of bed.  Pt required cues for forward weight shifting.  Pt performed additonal transfer from commode.  Pt does better with B arm  rests to push into standing.  Ambulation/Gait Ambulation/Gait assistance: Min guard Gait Distance (Feet): 50 Feet Assistive device: Rolling walker (2 wheeled) Gait Pattern/deviations: Step-to pattern;Decreased step length - right;Decreased step length - left;Decreased stance time - right;Decreased stride length;Decreased weight shift to right;Antalgic;Shuffle;Decreased dorsiflexion - right     General Gait Details: pt very slow, guarded and antalgic with ambulation using RW; Pt with shuffling pattern and required cues to weight shift R to improve foot clearance on the L side.   Stairs             Wheelchair Mobility    Modified Rankin (Stroke Patients Only)       Balance Overall balance assessment: Needs assistance Sitting-balance support: No upper extremity supported;Feet supported Sitting balance-Leahy Scale: Good     Standing balance support: Bilateral upper extremity supported;During functional activity Standing balance-Leahy Scale: Poor Standing balance comment: Reliant on BUE support                             Cognition Arousal/Alertness: Awake/alert Behavior During Therapy: WFL for tasks assessed/performed Overall Cognitive Status: Within Functional Limits for tasks assessed                                 General Comments: Pt very groggy throughout session. Per RN pt just recieved pain medications.       Exercises  General Comments        Pertinent Vitals/Pain Pain Assessment: 0-10 Pain Score: 8  Pain Location: R knee Pain Descriptors / Indicators: Aching;Operative site guarding Pain Intervention(s): Monitored during session;Repositioned    Home Living Family/patient expects to be discharged to:: Skilled nursing facility Living Arrangements: Alone                  Prior Function Level of Independence: Independent          PT Goals (current goals can now be found in the care plan section) Acute Rehab PT  Goals Patient Stated Goal: to go to SNF prior to return home Potential to Achieve Goals: Good    Frequency    7X/week      PT Plan Current plan remains appropriate    Co-evaluation              AM-PAC PT "6 Clicks" Mobility   Outcome Measure  Help needed turning from your back to your side while in a flat bed without using bedrails?: A Little Help needed moving from lying on your back to sitting on the side of a flat bed without using bedrails?: A Little Help needed moving to and from a bed to a chair (including a wheelchair)?: A Little Help needed standing up from a chair using your arms (e.g., wheelchair or bedside chair)?: A Little Help needed to walk in hospital room?: A Little Help needed climbing 3-5 steps with a railing? : A Lot 6 Click Score: 17    End of Session Equipment Utilized During Treatment: Gait belt Activity Tolerance: Patient limited by pain Patient left: in bed;with call bell/phone within reach;with nursing/sitter in room Nurse Communication: Mobility status PT Visit Diagnosis: Unsteadiness on feet (R26.81);Muscle weakness (generalized) (M62.81)     Time: [id number] PT Time Calculation (min) (ACUTE ONLY): 47 min  Charges:  $Gait Training: 8-22 mins $Therapeutic Activity: 23-37 mins                     Bryce Mejia, PTA Acute Rehabilitation Services Pager [phone] Office [phone]     Bryce Mejia Eli Hose 06/26/2019, 3:40 PM

## 2019-06-27 LAB — SARS CORONAVIRUS 2 (TAT 6-24 HRS): SARS Coronavirus 2: NEGATIVE

## 2019-06-27 LAB — GLUCOSE, CAPILLARY
Glucose-Capillary: 119 mg/dL — ABNORMAL HIGH (ref 70–99)
Glucose-Capillary: 111 mg/dL — ABNORMAL HIGH (ref 70–99)
Glucose-Capillary: 147 mg/dL — ABNORMAL HIGH (ref 70–99)

## 2019-06-27 NOTE — Care Management (Addendum)
[place] at [facility] aware.   On Saturday fax covid test results to [facility] at [phone] , than call [facility] cell [phone]. Then call [employer] [phone]. Patient's room number is 24B.      1530 test results are not back yet . Spoke to [employer] at [facility] , it is too late for patient to be transported today. Transport will need to be arranged for tomorrow. Called Dr Ninfa Linden office , was told to call Dr Sharol Given [phone] ( on call) and Dr Ninfa Linden [phone]. Called both numbers spoke to Dr Sharol Given , called Dr Ninfa Linden left message with nurse.    [place] discharge summary to [place] at [facility] .Confirmed he received fax. Awaiting todays, covid test results.    [facility] called back they are not accepting admissions at this time.  Bill with [employer] transport returned call. Cost of transport will be $450.00, they will have to know by 1400 today if patient is discharging. Awaiting covid results. Patient aware and will pay $450.00 for transport. [facility] does not need any paperwork for transport.  [place] waiting on call back from [facility].   Lanny Hurst with [facility] has offered a bed, patient would like to accept. Due to Covid, Lanny Hurst is requesting patient to arrive by ambulance, not a family member or friend. Spoke with Liliane Channel at [facility], because mileage is over 50 miles patient would have to private pay up front. Cost is over $1600. Patient aware. Lanny Hurst suggested calling [employer] in [place] for a price. Called same [phone], awaiting call back with a price.   Patient aware of all of above.      Called Phyllis at [facility] [phone] regarding dispostion, awaiting call back. Also last covid test was 06/20/19.  Magdalen Spatz RN [phone]

## 2019-06-27 NOTE — Discharge Instructions (Signed)
INSTRUCTIONS AFTER JOINT REPLACEMENT  ° °o Remove items at home which could result in a fall. This includes throw rugs or furniture in walking pathways °o ICE to the affected joint every three hours while awake for 30 minutes at a time, for at least the first 3-5 days, and then as needed for pain and swelling.  Continue to use ice for pain and swelling. You may notice swelling that will progress down to the foot and ankle.  This is normal after surgery.  Elevate your leg when you are not up walking on it.   °o Continue to use the breathing machine you got in the hospital (incentive spirometer) which will help keep your temperature down.  It is common for your temperature to cycle up and down following surgery, especially at night when you are not up moving around and exerting yourself.  The breathing machine keeps your lungs expanded and your temperature down. ° ° °DIET:  As you were doing prior to hospitalization, we recommend a well-balanced diet. ° °DRESSING / WOUND CARE / SHOWERING ° °Keep the surgical dressing until follow up.  The dressing is water proof, so you can shower without any extra covering.  IF THE DRESSING FALLS OFF or the wound gets wet inside, change the dressing with sterile gauze.  Please use good hand washing techniques before changing the dressing.  Do not use any lotions or creams on the incision until instructed by your surgeon.   ° °ACTIVITY ° °o Increase activity slowly as tolerated, but follow the weight bearing instructions below.   °o No driving for 6 weeks or until further direction given by your physician.  You cannot drive while taking narcotics.  °o No lifting or carrying greater than 10 lbs. until further directed by your surgeon. °o Avoid periods of inactivity such as sitting longer than an hour when not asleep. This helps prevent blood clots.  °o You may return to work once you are authorized by your doctor.  ° ° ° °WEIGHT BEARING  ° °Weight bearing as tolerated with assist  device (walker, cane, etc) as directed, use it as long as suggested by your surgeon or therapist, typically at least 4-6 weeks. ° ° °EXERCISES ° °Results after joint replacement surgery are often greatly improved when you follow the exercise, range of motion and muscle strengthening exercises prescribed by your doctor. Safety measures are also important to protect the joint from further injury. Any time any of these exercises cause you to have increased pain or swelling, decrease what you are doing until you are comfortable again and then slowly increase them. If you have problems or questions, call your caregiver or physical therapist for advice.  ° °Rehabilitation is important following a joint replacement. After just a few days of immobilization, the muscles of the leg can become weakened and shrink (atrophy).  These exercises are designed to build up the tone and strength of the thigh and leg muscles and to improve motion. Often times heat used for twenty to thirty minutes before working out will loosen up your tissues and help with improving the range of motion but do not use heat for the first two weeks following surgery (sometimes heat can increase post-operative swelling).  ° °These exercises can be done on a training (exercise) mat, on the floor, on a table or on a bed. Use whatever works the best and is most comfortable for you.    Use music or television while you are exercising so that   the exercises are a pleasant break in your day. This will make your life better with the exercises acting as a break in your routine that you can look forward to.   Perform all exercises about fifteen times, three times per day or as directed.  You should exercise both the operative leg and the other leg as well. ° °Exercises include: °  °• Quad Sets - Tighten up the muscle on the front of the thigh (Quad) and hold for 5-10 seconds.   °• Straight Leg Raises - With your knee straight (if you were given a brace, keep it on),  lift the leg to 60 degrees, hold for 3 seconds, and slowly lower the leg.  Perform this exercise against resistance later as your leg gets stronger.  °• Leg Slides: Lying on your back, slowly slide your foot toward your buttocks, bending your knee up off the floor (only go as far as is comfortable). Then slowly slide your foot back down until your leg is flat on the floor again.  °• Angel Wings: Lying on your back spread your legs to the side as far apart as you can without causing discomfort.  °• Hamstring Strength:  Lying on your back, push your heel against the floor with your leg straight by tightening up the muscles of your buttocks.  Repeat, but this time bend your knee to a comfortable angle, and push your heel against the floor.  You may put a pillow under the heel to make it more comfortable if necessary.  ° °A rehabilitation program following joint replacement surgery can speed recovery and prevent re-injury in the future due to weakened muscles. Contact your doctor or a physical therapist for more information on knee rehabilitation.  ° ° °CONSTIPATION ° °Constipation is defined medically as fewer than three stools per week and severe constipation as less than one stool per week.  Even if you have a regular bowel pattern at home, your normal regimen is likely to be disrupted due to multiple reasons following surgery.  Combination of anesthesia, postoperative narcotics, change in appetite and fluid intake all can affect your bowels.  ° °YOU MUST use at least one of the following options; they are listed in order of increasing strength to get the job done.  They are all available over the counter, and you may need to use some, POSSIBLY even all of these options:   ° °Drink plenty of fluids (prune juice may be helpful) and high fiber foods °Colace 100 mg by mouth twice a day  °Senokot for constipation as directed and as needed Dulcolax (bisacodyl), take with full glass of water  °Miralax (polyethylene glycol)  once or twice a day as needed. ° °If you have tried all these things and are unable to have a bowel movement in the first 3-4 days after surgery call either your surgeon or your primary doctor.   ° °If you experience loose stools or diarrhea, hold the medications until you stool forms back up.  If your symptoms do not get better within 1 week or if they get worse, check with your doctor.  If you experience "the worst abdominal pain ever" or develop nausea or vomiting, please contact the office immediately for further recommendations for treatment. ° ° °ITCHING:  If you experience itching with your medications, try taking only a single pain pill, or even half a pain pill at a time.  You can also use Benadryl over the counter for itching or also to   help with sleep.  ° °TED HOSE STOCKINGS:  Use stockings on both legs until for at least 2 weeks or as directed by physician office. They may be removed at night for sleeping. ° °MEDICATIONS:  See your medication summary on the “After Visit Summary” that nursing will review with you.  You may have some home medications which will be placed on hold until you complete the course of blood thinner medication.  It is important for you to complete the blood thinner medication as prescribed. ° °PRECAUTIONS:  If you experience chest pain or shortness of breath - call 911 immediately for transfer to the hospital emergency department.  ° °If you develop a fever greater that 101 F, purulent drainage from wound, increased redness or drainage from wound, foul odor from the wound/dressing, or calf pain - CONTACT YOUR SURGEON.   °                                                °FOLLOW-UP APPOINTMENTS:  If you do not already have a post-op appointment, please call the office for an appointment to be seen by your surgeon.  Guidelines for how soon to be seen are listed in your “After Visit Summary”, but are typically between 1-4 weeks after surgery. ° °OTHER INSTRUCTIONS:  ° °Knee  Replacement:  Do not place pillow under knee, focus on keeping the knee straight while resting. CPM instructions: 0-90 degrees, 2 hours in the morning, 2 hours in the afternoon, and 2 hours in the evening. Place foam block, curve side up under heel at all times except when in CPM or when walking.  DO NOT modify, tear, cut, or change the foam block in any way. ° °MAKE SURE YOU:  °• Understand these instructions.  °• Get help right away if you are not doing well or get worse.  ° ° °Thank you for letting us be a part of your medical care team.  It is a privilege we respect greatly.  We hope these instructions will help you stay on track for a fast and full recovery!  ° °Information on my medicine - ELIQUIS® (apixaban) ° °This medication education was reviewed with me or my healthcare representative as part of my discharge preparation.   ° °Why was Eliquis® prescribed for you? °Eliquis® was prescribed for you to reduce the risk of forming blood clots that can cause a stroke if you have a medical condition called atrial fibrillation (a type of irregular heartbeat) OR to reduce the risk of a blood clots forming after orthopedic surgery. ° °What do You need to know about Eliquis® ? °Take your Eliquis® TWICE DAILY - one tablet in the morning and one tablet in the evening with or without food.  It would be best to take the doses about the same time each day. ° °If you have difficulty swallowing the tablet whole please discuss with your pharmacist how to take the medication safely. ° °Take Eliquis® exactly as prescribed by your doctor and DO NOT stop taking Eliquis® without talking to the doctor who prescribed the medication.  Stopping may increase your risk of developing a new clot or stroke.  Refill your prescription before you run out. ° °After discharge, you should have regular check-up appointments with your healthcare provider that is prescribing your Eliquis®.  In the future your dose may need   to be changed if your  kidney function or weight changes by a significant amount or as you get older. ° °What do you do if you miss a dose? °If you miss a dose, take it as soon as you remember on the same day and resume taking twice daily.  Do not take more than one dose of ELIQUIS at the same time. ° °Important Safety Information °A possible side effect of Eliquis® is bleeding. You should call your healthcare provider right away if you experience any of the following: °? Bleeding from an injury or your nose that does not stop. °? Unusual colored urine (red or dark brown) or unusual colored stools (red or black). °? Unusual bruising for unknown reasons. °? A serious fall or if you hit your head (even if there is no bleeding). ° °Some medicines may interact with Eliquis® and might increase your risk of bleeding or clotting while on Eliquis®. To help avoid this, consult your healthcare provider or pharmacist prior to using any new prescription or non-prescription medications, including herbals, vitamins, non-steroidal anti-inflammatory drugs (NSAIDs) and supplements. ° °This website has more information on Eliquis® (apixaban): [email or web address].com. ° ° ° ° °

## 2019-06-27 NOTE — Progress Notes (Signed)
Physical Therapy Treatment Patient Details Name: Bryce Mejia. MRN: [id number] DOB: 1946-04-09 Today's Date: 06/27/2019    History of Present Illness Pt is a 73 y/o male s/p R TKA. PMH includes DM, HTN, and a flutter s/p cardioversion.     PT Comments    Pt performed gt training and functional mobility during session.  He presents with decreased ROM to R knee and, shiny edematous skin.  Informed MD and placed TED hose per MD request.  Pt is slow and guarded due to pain but remains to progress.  No bleeding noted from incision today.  Pt able to tolerate therapeutic exercises.  Pt resting in supported extension with ice pack applied to R knee.     Follow Up Recommendations  SNF     Equipment Recommendations  None recommended by PT    Recommendations for Other Services       Precautions / Restrictions Precautions Precautions: Knee Precaution Booklet Issued: No Precaution Comments: Reviewed knee precautions with pt  Restrictions Weight Bearing Restrictions: Yes RLE Weight Bearing: Weight bearing as tolerated    Mobility  Bed Mobility Overal bed mobility: Needs Assistance Bed Mobility: Supine to Sit     Supine to sit: Supervision     General bed mobility comments: Increased time and effort, + rail use. and assisted RLE with LLE.  Transfers Overall transfer level: Needs assistance Equipment used: Rolling walker (2 wheeled) Transfers: Sit to/from Stand Sit to Stand: Min assist;From elevated surface         General transfer comment: Cues for hand placement and RLE advancement to edge of bed.  Pt required cues for forward weight shifting.  HOB elevated.  Ambulation/Gait Ambulation/Gait assistance: Min guard Gait Distance (Feet): 70 Feet Assistive device: Rolling walker (2 wheeled) Gait Pattern/deviations: Step-to pattern;Decreased step length - right;Decreased step length - left;Decreased stance time - right;Decreased stride length;Decreased weight shift  to right;Antalgic;Shuffle;Decreased dorsiflexion - right Gait velocity: decreased   General Gait Details: pt very slow, guarded and antalgic with ambulation using RW; Pt with shuffling pattern and required cues to weight shift R to improve foot clearance on the L side.   Stairs             Wheelchair Mobility    Modified Rankin (Stroke Patients Only)       Balance Overall balance assessment: Needs assistance Sitting-balance support: No upper extremity supported;Feet supported Sitting balance-Leahy Scale: Good       Standing balance-Leahy Scale: Poor                              Cognition Arousal/Alertness: Awake/alert Behavior During Therapy: WFL for tasks assessed/performed Overall Cognitive Status: Within Functional Limits for tasks assessed                                        Exercises Total Joint Exercises Ankle Circles/Pumps: AROM;Both;10 reps;Supine Quad Sets: AROM;Strengthening;Right;10 reps;Supine Heel Slides: Strengthening;Right;10 reps;Supine;AAROM Hip ABduction/ADduction: AAROM;Strengthening;Right;10 reps;Supine Straight Leg Raises: AAROM;Right;10 reps;Supine Long Arc Quad: AAROM;Right;5 reps Goniometric ROM: 10-55 degrees    General Comments        Pertinent Vitals/Pain      Home Living                      Prior Function  PT Goals (current goals can now be found in the care plan section) Acute Rehab PT Goals Patient Stated Goal: to go to SNF prior to return home PT Goal Formulation: With patient Potential to Achieve Goals: Good Progress towards PT goals: Progressing toward goals    Frequency    7X/week      PT Plan Current plan remains appropriate    Co-evaluation              AM-PAC PT "6 Clicks" Mobility   Outcome Measure  Help needed turning from your back to your side while in a flat bed without using bedrails?: A Little Help needed moving from lying on your  back to sitting on the side of a flat bed without using bedrails?: A Little Help needed moving to and from a bed to a chair (including a wheelchair)?: A Little Help needed standing up from a chair using your arms (e.g., wheelchair or bedside chair)?: A Little Help needed to walk in hospital room?: A Little Help needed climbing 3-5 steps with a railing? : A Lot 6 Click Score: 17    End of Session Equipment Utilized During Treatment: Gait belt Activity Tolerance: Patient limited by pain Patient left: in bed;with call bell/phone within reach;with nursing/sitter in room Nurse Communication: Mobility status PT Visit Diagnosis: Unsteadiness on feet (R26.81);Muscle weakness (generalized) (M62.81)     Time: [id number] PT Time Calculation (min) (ACUTE ONLY): 27 min  Charges:  $Gait Training: 8-22 mins $Therapeutic Exercise: 8-22 mins                     Bryce Mejia, PTA Acute Rehabilitation Services Pager [phone] Office [phone]     Bryce Mejia Eli Hose 06/27/2019, 3:22 PM

## 2019-06-27 NOTE — Progress Notes (Signed)
Subjective: 3 Days Post-Op Procedure(s) (LRB): RIGHT TOTAL KNEE ARTHROPLASTY (Right) Patient reports pain as moderate.    Objective: Vital signs in last 24 hours: Temp:  [97.8 F (36.6 C)-100 F (37.8 C)] 98.7 F (37.1 C) (08/14 0342) Pulse Rate:  [81-105] 81 (08/14 0342) Resp:  [17-19] 17 (08/14 0342) BP: (120-147)/(47-71) 120/57 (08/14 0342) SpO2:  [92 %-99 %] 99 % (08/14 0342) Weight:  CA:5685710 kg] 115 kg (08/13 0736)  Intake/Output from previous day: 08/13 0701 - 08/14 0700 In: 3024.5 [P.O.:660; I.V.:2364.5] Out: -  Intake/Output this shift: Total I/O In: 2364.5 [I.V.:2364.5] Out: -   Recent Labs    06/25/19 0344  HGB 10.5*   Recent Labs    06/25/19 0344  WBC 9.0  RBC 3.30*  HCT 31.9*  PLT 122*   Recent Labs    06/25/19 0344  NA 136  K 5.1  CL 106  CO2 20*  BUN 40*  CREATININE 2.14*  GLUCOSE 173*  CALCIUM 8.2*   No results for input(s): LABPT, INR in the last 72 hours.  Sensation intact distally Intact pulses distally Dorsiflexion/Plantar flexion intact Incision: scant drainage No cellulitis present Compartment soft   Assessment/Plan: 3 Days Post-Op Procedure(s) (LRB): RIGHT TOTAL KNEE ARTHROPLASTY (Right) Up with therapy Discharge to SNF today.      Mcarthur Rossetti 06/27/2019, 6:59 AM

## 2019-06-27 NOTE — Discharge Summary (Signed)
Patient ID: Bryce Mejia. MRN: [id number] DOB/AGE: December 01, 1945 73 y.o.  Admit date: 06/24/2019 Discharge date: 06/27/2019  Admission Diagnoses:  Principal Problem:   Unilateral primary osteoarthritis, right knee Active Problems:   Status post total right knee replacement   Discharge Diagnoses:  Same  Past Medical History:  Diagnosis Date  . Chronic kidney disease, stage 3 (HCC)    CKD stage 3  . Diabetes mellitus, type II ([place])   . Family history of adverse reaction to anesthesia    Mom- difficult to wake up from anesthesia  . H/O atrial flutter    s/p DCCV 08/01/17  . Heart attack ([place])   . Sleep apnea     Surgeries: Procedure(s): RIGHT TOTAL KNEE ARTHROPLASTY on 06/24/2019   Consultants:   Discharged Condition: Improved  Hospital Course: Zaccai Drudge. is an 73 y.o. male who was admitted 06/24/2019 for operative treatment ofUnilateral primary osteoarthritis, right knee. Patient has severe unremitting pain that affects sleep, daily activities, and work/hobbies. After pre-op clearance the patient was taken to the operating room on 06/24/2019 and underwent  Procedure(s): RIGHT TOTAL KNEE ARTHROPLASTY.    Patient was given perioperative antibiotics:  Anti-infectives (From admission, onward)   Start     Dose/Rate Route Frequency Ordered Stop   06/24/19 1800  ceFAZolin (ANCEF) IVPB 2g/100 mL premix     2 g 200 mL/hr over 30 Minutes Intravenous Every 6 hours 06/24/19 1530 06/25/19 0002   06/24/19 0945  ceFAZolin (ANCEF) IVPB 2g/100 mL premix     2 g 200 mL/hr over 30 Minutes Intravenous On call to O.R. 06/24/19 0939 06/24/19 1236   06/24/19 0943  ceFAZolin (ANCEF) 2-4 GM/100ML-% IVPB    Note to Pharmacy: Merryl Hacker   : cabinet override      06/24/19 0943 06/24/19 1206       Patient was given sequential compression devices, early ambulation, and chemoprophylaxis to prevent DVT.  Patient benefited maximally from hospital stay and there were no  complications.    Recent vital signs:  Patient Vitals for the past 24 hrs:  BP Temp Temp src Pulse Resp SpO2 Height Weight  06/27/19 0342 (!) 120/57 98.7 F (37.1 C) Oral 81 17 99 % - -  06/26/19 2217 (!) 143/50 - - - - - - -  06/26/19 1959 (!) 145/47 100 F (37.8 C) Oral (!) 105 18 97 % - -  06/26/19 1806 (!) 147/71 99.7 F (37.6 C) Oral 96 - 93 % - -  06/26/19 0922 132/60 - - 89 19 92 % - -  06/26/19 0736 - - - - - - - 115 kg  06/26/19 0734 (!) 142/61 97.8 F (36.6 C) - 82 18 - 5' 8.5" (1.74 m) -     Recent laboratory studies:  Recent Labs    06/25/19 0344  WBC 9.0  HGB 10.5*  HCT 31.9*  PLT 122*  NA 136  K 5.1  CL 106  CO2 20*  BUN 40*  CREATININE 2.14*  GLUCOSE 173*  CALCIUM 8.2*     Discharge Medications:   Allergies as of 06/27/2019   No Known Allergies     Medication List    STOP taking these medications   metoprolol tartrate 25 MG tablet Commonly known as: LOPRESSOR     TAKE these medications   aspirin EC 81 MG tablet Take 81 mg by mouth daily.   cetirizine 10 MG tablet Commonly known as: ZYRTEC Take 10 mg by mouth daily.  clonazePAM 0.5 MG tablet Commonly known as: KLONOPIN Take 0.25-0.5 mg by mouth 2 (two) times daily as needed for anxiety.   Eliquis 5 MG Tabs tablet Generic drug: apixaban Take 5 mg by mouth 2 (two) times daily.   finasteride 5 MG tablet Commonly known as: PROSCAR Take 5 mg by mouth daily.   FLAXSEED OIL PO Take 1 capsule by mouth daily.   fluticasone 50 MCG/ACT nasal spray Commonly known as: FLONASE Place 2 sprays into both nostrils daily as needed for allergies or rhinitis.   furosemide 20 MG tablet Commonly known as: LASIX Take 20 mg by mouth every other day.   levothyroxine 175 MCG tablet Commonly known as: SYNTHROID Take 175 mcg by mouth daily before breakfast.   lisinopril 10 MG tablet Commonly known as: ZESTRIL Take 10 mg by mouth daily.   methocarbamol 500 MG tablet Commonly known as:  ROBAXIN Take 1 tablet (500 mg total) by mouth every 6 (six) hours as needed for muscle spasms.   NovoLIN 70/30 ReliOn (70-30) 100 UNIT/ML injection Generic drug: insulin NPH-regular Human INJECT 15 UNITS SUBCUTANEOUSLY TWICE DAILY WITH MEALS What changed: See the new instructions.   OCUVITE ADULT 50+ PO Take 1 tablet by mouth daily.   oxyCODONE 5 MG immediate release tablet Commonly known as: Oxy IR/ROXICODONE Take 1-2 tablets (5-10 mg total) by mouth every 4 (four) hours as needed for moderate pain (pain score 4-6).   pravastatin 20 MG tablet Commonly known as: PRAVACHOL Take 20 mg by mouth every other day.   terazosin 10 MG capsule Commonly known as: HYTRIN Take 10 mg by mouth at bedtime.   traZODone 100 MG tablet Commonly known as: DESYREL Take 50 mg by mouth at bedtime.   Vitamin D 50 MCG (2000 UT) Caps Take 2,000 Units by mouth daily.            Durable Medical Equipment  (From admission, onward)         Start     Ordered   06/24/19 1531  DME Walker rolling  Once    Question:  Patient needs a walker to treat with the following condition  Answer:  Status post total right knee replacement   06/24/19 1530   06/24/19 1531  DME 3 n 1  Once     06/24/19 1530          Diagnostic Studies: Dg Knee Right Port  Result Date: 06/24/2019 CLINICAL DATA:  Post total right knee arthroplasty EXAM: PORTABLE RIGHT KNEE - 1-2 VIEW COMPARISON:  Right knee radiographs 12/30/2018 FINDINGS: Patient is post total knee arthroplasty and posterior patellar resurfacing with skin staples seen anteriorly. There are expected postsurgical soft tissue changes including air within the joint. No unexpected radiopaque foreign body. Alignment of the prosthesis is satisfactory. Small nondisplaced fracture line along the resurfaced a lateral tibial plateau. IMPRESSION: 1. Post right total knee arthroplasty. No unexpected foreign bodies. Hardware appears normally aligned. 2. Small nondisplaced  fracture line along the resurfaced lateral tibial plateau. Electronically Signed   By: Lovena Le M.D.   On: 06/24/2019 16:16    Disposition: Discharge disposition: 03-Skilled [place]    Mcarthur Rossetti, MD. Schedule an appointment as soon as possible for a visit in 2 week(s).   Specialty: Orthopedic Surgery Contact information: [place] [address] [phone]            Signed: Mcarthur Rossetti 06/27/2019, 7:00  AM     

## 2019-06-28 LAB — GLUCOSE, CAPILLARY: Glucose-Capillary: 140 mg/dL — ABNORMAL HIGH (ref 70–99)

## 2019-06-28 NOTE — TOC Transition Note (Addendum)
Transition of Care [facility]) - CM/SW Discharge Note   Patient Details  Name: Pasqual Bandy. MRN: [id number] Date of Birth: May 22, 1946  Transition of [facility] CM/SW Contact:  Bary Castilla, LCSW Phone Number: [phone] 06/28/2019, 10:54 AM   Clinical Narrative:     Patient will DC to: [place]? Anticipated DC date:?06/28/19 Family notified:?Son Transport [id number] tranpsort    Per MD patient ready for DC to [place]. RN, patient, patient's family, and facility notified of DC.CSW spoke with transportation company and they stated that no other paperwork is needed for transport. Discharge Summary sent to facility by RN. [facility] R[facility] transport requested for patient.   CSW signing off.   [place], [place] [phone]    Final next level of care: Skilled Nursing Facility Barriers to Discharge: No Barriers Identified   Patient Goals and CMS Choice Patient states their goals for this hospitalization and ongoing recovery are:: to get better CMS Medicare.gov Compare Post Acute Care list provided to:: Patient Choice offered to / list presented to : Patient  Discharge Placement              [facility]) Patient to be transferred to facility by: Toney Rakes transport [phone] Name of family member notified: Son Patient and family notified of of transfer: 06/28/19  Discharge Plan and Services   Discharge Planning Services: CM Consult Post Acute Care Choice: [place]          DME Arranged: N/A         HH Arranged: NA          Social Determinants of Health (SDOH) Interventions     Readmission Risk Interventions No flowsheet data found.

## 2019-06-28 NOTE — Progress Notes (Signed)
Physical Therapy Treatment Patient Details Name: Bryce Mejia. MRN: [id number] DOB: 1946/01/06 Today's Date: 06/28/2019    History of Present Illness Pt is a 73 y/o male s/p R TKA. PMH includes DM, HTN, and a flutter s/p cardioversion.     PT Comments    Pt performed gt training, functional mobility and guidance through supine LE exercises.  Pt continues to require min guard to min assistance.  He is slow and guarded due to pain.  Pt continues to benefit from SNF placement to improve strength and function before returning home to private residence.    Follow Up Recommendations  SNF     Equipment Recommendations  None recommended by PT    Recommendations for Other Services       Precautions / Restrictions Precautions Precautions: Knee Precaution Booklet Issued: No Restrictions Weight Bearing Restrictions: Yes RLE Weight Bearing: Weight bearing as tolerated    Mobility  Bed Mobility Overal bed mobility: Needs Assistance Bed Mobility: Sit to Supine       Sit to supine: Min assist   General bed mobility comments: Pt seated edge of bed on arrival.  Min assistance to lift LEs to return to bed.  Bed placed in trendelenberg to boost to [facility].  Transfers Overall transfer level: Needs assistance Equipment used: Rolling walker (2 wheeled) Transfers: Sit to/from Stand Sit to Stand: Min assist;From elevated surface         General transfer comment: Cues for hand placement and RLE advancement to edge of bed.  Pt required cues for forward weight shifting.  HOB elevated.  Ambulation/Gait Ambulation/Gait assistance: Min guard Gait Distance (Feet): 60 Feet Assistive device: Rolling walker (2 wheeled) Gait Pattern/deviations: Step-to pattern;Decreased step length - right;Decreased step length - left;Decreased stance time - right;Decreased stride length;Decreased weight shift to right;Antalgic;Shuffle;Decreased dorsiflexion - right Gait velocity: decreased   General  Gait Details: Pt presents with flexed R knee in stance phase.  Pt required cues for upper trunk control and LE progression.  Cues to lift L foot to avoid stomping.   Stairs             Wheelchair Mobility    Modified Rankin (Stroke Patients Only)       Balance Overall balance assessment: Needs assistance Sitting-balance support: No upper extremity supported;Feet supported Sitting balance-Leahy Scale: Good     Standing balance support: Bilateral upper extremity supported;During functional activity Standing balance-Leahy Scale: Poor Standing balance comment: Reliant on BUE support                             Cognition Arousal/Alertness: Awake/alert Behavior During Therapy: WFL for tasks assessed/performed Overall Cognitive Status: Within Functional Limits for tasks assessed                                        Exercises Total Joint Exercises Ankle Circles/Pumps: AROM;Both;10 reps;Supine Quad Sets: AROM;Strengthening;Right;10 reps;Supine Heel Slides: Strengthening;Right;10 reps;Supine;AAROM Hip ABduction/ADduction: AAROM;Strengthening;Right;10 reps;Supine Straight Leg Raises: AAROM;Right;10 reps;Supine    General Comments        Pertinent Vitals/Pain Pain Location: R knee Pain Descriptors / Indicators: Aching;Operative site guarding    Home Living                      Prior Function            PT  Goals (current goals can now be found in the care plan section) Acute Rehab PT Goals Patient Stated Goal: to go to SNF prior to return home Potential to Achieve Goals: Good Progress towards PT goals: Progressing toward goals    Frequency    7X/week      PT Plan Current plan remains appropriate    Co-evaluation              AM-PAC PT "6 Clicks" Mobility   Outcome Measure  Help needed turning from your back to your side while in a flat bed without using bedrails?: A Little Help needed moving from lying on  your back to sitting on the side of a flat bed without using bedrails?: A Little Help needed moving to and from a bed to a chair (including a wheelchair)?: A Little Help needed standing up from a chair using your arms (e.g., wheelchair or bedside chair)?: A Little Help needed to walk in hospital room?: A Little Help needed climbing 3-5 steps with a railing? : A Lot 6 Click Score: 17    End of Session Equipment Utilized During Treatment: Gait belt Activity Tolerance: Patient limited by pain Patient left: in bed;with call bell/phone within reach;with nursing/sitter in room Nurse Communication: Mobility status PT Visit Diagnosis: Unsteadiness on feet (R26.81);Muscle weakness (generalized) (M62.81)     Time: [id number] PT Time Calculation (min) (ACUTE ONLY): 39 min  Charges:  $Gait Training: 8-22 mins $Therapeutic Exercise: 8-22 mins $Therapeutic Activity: 8-22 mins                     Governor Rooks, PTA Acute Rehabilitation Services Pager [phone] Office [phone]     Tyne Banta Eli Hose 06/28/2019, 12:18 PM

## 2019-06-28 NOTE — Progress Notes (Signed)
RN called [facility] Lu[facility] and gave report to [place], [place]. CM Lisa notified and she is going to call transport. Pt notified of update. NT will help pack up belongings

## 2019-06-28 NOTE — Discharge Summary (Signed)
Discharge Diagnoses:  Principal Problem:   Unilateral primary osteoarthritis, right knee Active Problems:   Status post total right knee replacement   Surgeries: Procedure(s): RIGHT TOTAL KNEE ARTHROPLASTY on 06/24/2019    Consultants:   Discharged Condition: Improved  Hospital Course: Hannon Cera. is an 73 y.o. male who was admitted 06/24/2019 with a chief complaint of right knee osteoarthritis, with a final diagnosis of Osteoarthritis Right Knee.  Patient was brought to the operating room on 06/24/2019 and underwent Procedure(s): RIGHT TOTAL KNEE ARTHROPLASTY.    Patient was given perioperative antibiotics:  Anti-infectives (From admission, onward)   Start     Dose/Rate Route Frequency Ordered Stop   06/24/19 1800  ceFAZolin (ANCEF) IVPB 2g/100 mL premix     2 g 200 mL/hr over 30 Minutes Intravenous Every 6 hours 06/24/19 1530 06/25/19 0002   06/24/19 0945  ceFAZolin (ANCEF) IVPB 2g/100 mL premix     2 g 200 mL/hr over 30 Minutes Intravenous On call to O.R. 06/24/19 0939 06/24/19 1236   06/24/19 0943  ceFAZolin (ANCEF) 2-4 GM/100ML-% IVPB    Note to Pharmacy: Merryl Hacker   : cabinet override      06/24/19 0943 06/24/19 1206    .  Patient was given sequential compression devices, early ambulation, and aspirin for DVT prophylaxis.  Recent vital signs:  Patient Vitals for the past 24 hrs:  BP Temp Temp src Pulse Resp SpO2  06/28/19 0817 (!) 140/57 98.4 F (36.9 C) Oral 90 18 97 %  06/28/19 0407 (!) 155/65 98.9 F (37.2 C) Oral 96 19 96 %  06/27/19 1945 (!) 145/59 99.5 F (37.5 C) Oral 89 17 95 %  06/27/19 1415 (!) 146/53 98.7 F (37.1 C) Oral 82 18 96 %  .  Recent laboratory studies: No results found.  Discharge Medications:   Allergies as of 06/28/2019   No Known Allergies     Medication List    STOP taking these medications   metoprolol tartrate 25 MG tablet Commonly known as: LOPRESSOR     TAKE these medications   aspirin EC 81 MG  tablet Take 81 mg by mouth daily.   cetirizine 10 MG tablet Commonly known as: ZYRTEC Take 10 mg by mouth daily.   clonazePAM 0.5 MG tablet Commonly known as: KLONOPIN Take 0.25-0.5 mg by mouth 2 (two) times daily as needed for anxiety.   Eliquis 5 MG Tabs tablet Generic drug: apixaban Take 5 mg by mouth 2 (two) times daily.   finasteride 5 MG tablet Commonly known as: PROSCAR Take 5 mg by mouth daily.   FLAXSEED OIL PO Take 1 capsule by mouth daily.   fluticasone 50 MCG/ACT nasal spray Commonly known as: FLONASE Place 2 sprays into both nostrils daily as needed for allergies or rhinitis.   furosemide 20 MG tablet Commonly known as: LASIX Take 20 mg by mouth every other day.   levothyroxine 175 MCG tablet Commonly known as: SYNTHROID Take 175 mcg by mouth daily before breakfast.   lisinopril 10 MG tablet Commonly known as: ZESTRIL Take 10 mg by mouth daily.   methocarbamol 500 MG tablet Commonly known as: ROBAXIN Take 1 tablet (500 mg total) by mouth every 6 (six) hours as needed for muscle spasms.   NovoLIN 70/30 ReliOn (70-30) 100 UNIT/ML injection Generic drug: insulin NPH-regular Human INJECT 15 UNITS SUBCUTANEOUSLY TWICE DAILY WITH MEALS What changed: See the new instructions.   OCUVITE ADULT 50+ PO Take 1 tablet by mouth daily.  oxyCODONE 5 MG immediate release tablet Commonly known as: Oxy IR/ROXICODONE Take 1-2 tablets (5-10 mg total) by mouth every 4 (four) hours as needed for moderate pain (pain score 4-6).   pravastatin 20 MG tablet Commonly known as: PRAVACHOL Take 20 mg by mouth every other day.   terazosin 10 MG capsule Commonly known as: HYTRIN Take 10 mg by mouth at bedtime.   traZODone 100 MG tablet Commonly known as: DESYREL Take 50 mg by mouth at bedtime.   Vitamin D 50 MCG (2000 UT) Caps Take 2,000 Units by mouth daily.            Durable Medical Equipment  (From admission, onward)         Start     Ordered    06/24/19 1531  DME Walker rolling  Once    Question:  Patient needs a walker to treat with the following condition  Answer:  Status post total right knee replacement   06/24/19 1530   06/24/19 1531  DME 3 n 1  Once     06/24/19 1530          Diagnostic Studies: Dg Knee Right Port  Result Date: 06/24/2019 CLINICAL DATA:  Post total right knee arthroplasty EXAM: PORTABLE RIGHT KNEE - 1-2 VIEW COMPARISON:  Right knee radiographs 12/30/2018 FINDINGS: Patient is post total knee arthroplasty and posterior patellar resurfacing with skin staples seen anteriorly. There are expected postsurgical soft tissue changes including air within the joint. No unexpected radiopaque foreign body. Alignment of the prosthesis is satisfactory. Small nondisplaced fracture line along the resurfaced a lateral tibial plateau. IMPRESSION: 1. Post right total knee arthroplasty. No unexpected foreign bodies. Hardware appears normally aligned. 2. Small nondisplaced fracture line along the resurfaced lateral tibial plateau. Electronically Signed   By: Lovena Le M.D.   On: 06/24/2019 16:16    Patient benefited maximally from their hospital stay and there were no complications.     Disposition: Discharge disposition: 03-Skilled Nursing Facility      Discharge Instructions    Call MD / Call 911   Complete by: As directed    If you experience chest pain or shortness of breath, CALL 911 and be transported to the hospital emergency room.  If you develope a fever above 101 F, pus (white drainage) or increased drainage or redness at the wound, or calf pain, call your surgeon's office.   Constipation Prevention   Complete by: As directed    Drink plenty of fluids.  Prune juice may be helpful.  You may use a stool softener, such as Colace (over the counter) 100 mg twice a day.  Use MiraLax (over the counter) for constipation as needed.   Diet - low sodium heart healthy   Complete by: As directed    Increase activity slowly  as tolerated   Complete by: As directed      Follow-up Information    Mcarthur Rossetti, MD. Schedule an appointment as soon as possible for a visit in 2 week(s).   Specialty: Orthopedic Surgery Contact information: [place] [address] [phone]            Signed: Erlinda Hong, Hershal Coria 06/28/2019, 8:18 AM  Macarthur Critchley [phone]

## 2019-06-28 NOTE — Progress Notes (Signed)
Patient is very eager to be discharged today due to financial co-pay with his insurance.

## 2019-06-28 NOTE — Progress Notes (Signed)
Subjective: 4 Days Post-Op Procedure(s) (LRB): RIGHT TOTAL KNEE ARTHROPLASTY (Right) Patient reports pain as moderate and severe.   Anxious for DC to rehab.   Objective: Vital signs in last 24 hours: Temp:  [98.7 F (37.1 C)-99.5 F (37.5 C)] 98.9 F (37.2 C) (08/15 0407) Pulse Rate:  [82-96] 96 (08/15 0407) Resp:  [17-19] 19 (08/15 0407) BP: (145-155)/(53-65) 155/65 (08/15 0407) SpO2:  [95 %-96 %] 96 % (08/15 0407)  Intake/Output from previous day: 08/14 0701 - 08/15 0700 In: 720 [P.O.:720] Out: -  Intake/Output this shift: No intake/output data recorded.  No results for input(s): HGB in the last 72 hours. No results for input(s): WBC, RBC, HCT, PLT in the last 72 hours. No results for input(s): NA, K, CL, CO2, BUN, CREATININE, GLUCOSE, CALCIUM in the last 72 hours. No results for input(s): LABPT, INR in the last 72 hours.  Right knee dressing intact and without drainage.   Assessment/Plan: 4 Days Post-Op Procedure(s) (LRB): RIGHT TOTAL KNEE ARTHROPLASTY (Right) Discharge to SNF-rehab today Prescriptions for SNF on paper chart.   Anticipated LOS equal to or greater than 2 midnights due to - Age 73 and older with one or more of the following:  - Obesity  - Expected need for hospital services (PT, OT, Nursing) required for safe  discharge  - Anticipated need for postoperative skilled nursing care or inpatient rehab  - Active co-morbidities: Diabetes OR   - Unanticipated findings during/Post Surgery: None  - Patient is a high risk of re-admission due to: None    Kevontae Burgoon, PA-C 06/28/2019, 8:14 AM [facility] [phone]

## 2019-07-02 ENCOUNTER — Encounter: Payer: Self-pay | Admitting: Physician Assistant

## 2019-07-02 ENCOUNTER — Ambulatory Visit (INDEPENDENT_AMBULATORY_CARE_PROVIDER_SITE_OTHER): Payer: Medicare Other | Admitting: Physician Assistant

## 2019-07-02 DIAGNOSIS — Z96651 Presence of right artificial knee joint: Secondary | ICD-10-CM

## 2019-07-02 NOTE — Progress Notes (Signed)
HPI: [name] returns only 8 days status post right total knee arthroplasty.  He has had a skilled nursing facility.  He states overall he is doing well he is having considerable mental swelling in the right leg.  He is on chronic Eliquis.  He has had no shortness of breath fevers or chills.  He is ambulating some with a walker.  Comes in today in a wheelchair.  Says he is having significant pain is trying to get off narcotics mainly taking Tylenol for his pain.  Physical exam: Right knee has approximately 10 degree extension lag in flexion and 90 degrees.  Considerable swelling throughout the right leg.  Calf is supple minimal discomfort.  Dorsiflexion plantarflexion ankle intact.  Surgical incision is well approximated with staples but appears too early to remove staples today.  Impression: 8 days status post right total knee arthroplasty  Plan at this point time would like her to physical therapy to continue work on range of motion strengthening particularly work on extension.  He is to work on elevation to help with the swelling.  Also he should wear a thigh-high compression hose during the day.  We will see him back on Monday, August 24, for staple removal and wound check.  Questions were encouraged and answered at length today.

## 2019-07-07 ENCOUNTER — Ambulatory Visit (INDEPENDENT_AMBULATORY_CARE_PROVIDER_SITE_OTHER): Payer: Medicare Other | Admitting: Physician Assistant

## 2019-07-07 ENCOUNTER — Encounter: Payer: Self-pay | Admitting: Physician Assistant

## 2019-07-07 DIAGNOSIS — Z96651 Presence of right artificial knee joint: Secondary | ICD-10-CM

## 2019-07-07 NOTE — Progress Notes (Signed)
HPI: [name] returns today to remove the staples after undergoing his right total knee arthroplasty.  He is now 2 weeks postop.  He is in a skilled facility.  He has no complaints other than some insomnia at night.  Physical exam: Right knee is full extension flexion 90 degrees.  Calf supple.  Staples well approximate the incision no signs of infection.  Impression: Status post right total knee arthroplasty  Plan: Staples removed Steri-Strips applied.  He is able to get incision wet shower.  He will continue to work with physical therapy to work on range of motion strengthening the knee.  We will see him back in 1 month sooner if there is any questions or concerns.  Did write in his report of consultation that he may benefit from Restoril at night to help him sleep.

## 2019-07-11 ENCOUNTER — Other Ambulatory Visit: Payer: Self-pay

## 2019-07-11 ENCOUNTER — Telehealth: Payer: Self-pay | Admitting: Orthopaedic Surgery

## 2019-07-11 ENCOUNTER — Other Ambulatory Visit: Payer: Self-pay | Admitting: Radiology

## 2019-07-11 DIAGNOSIS — Z96651 Presence of right artificial knee joint: Secondary | ICD-10-CM

## 2019-07-11 NOTE — Telephone Encounter (Signed)
Received call from [facility] Rollin-(PT assistant) with [facility] and Rehab stating patient's range of motion is -12 to 85 degrees. Nira Conn said they are trying everything they can to work with the patient. She said patient is in so much discomfort and only taking tylenol. Nira Conn said patient is not taking the Oxycodone and asked if there is any suggestions from the doctor. The number to contact Nira Conn is [phone]

## 2019-07-11 NOTE — Telephone Encounter (Signed)
He is still in the facility  And says he can't take the oxy cause it stops him up so much  I told him to just bend bend bend that knee!

## 2019-07-11 NOTE — Telephone Encounter (Signed)
He needs to take his pain meds for therapy and we need to get him into outpatient PT ASAP.

## 2019-07-11 NOTE — Telephone Encounter (Signed)
See below

## 2019-08-04 ENCOUNTER — Ambulatory Visit (INDEPENDENT_AMBULATORY_CARE_PROVIDER_SITE_OTHER): Payer: Medicare Other | Admitting: Physician Assistant

## 2019-08-04 ENCOUNTER — Encounter: Payer: Self-pay | Admitting: Physician Assistant

## 2019-08-04 DIAGNOSIS — Z96651 Presence of right artificial knee joint: Secondary | ICD-10-CM

## 2019-08-04 MED ORDER — HYDROCODONE-ACETAMINOPHEN 5-325 MG PO TABS: 1.0000 | ORAL_TABLET | Freq: Four times a day (QID) | ORAL | 0 refills | Status: DC | PRN

## 2019-08-04 NOTE — Progress Notes (Signed)
HPI: Mr. Villacres returns today 7 weeks status post right total knee arthroplasty.  He states his knee is doing well.  He is gotten into formal outpatient therapy and is working on range of motion he has had 2 visits thus far.  Unfortunately did develop a UTI was seen emergency room this weekend given Cipro 500 mg twice daily for 14 days.  He is otherwise had no fevers chills.  Physical exam: Right knee no abnormal warmth erythema surgical incisions healing well.  He has full extension flexion to 95 degrees.  Calf supple nontender.  Slight edema right lower extremity. Ambulates without any assistive device.  Impression: Status post right total knee arthroplasty 06/24/2019 Acute UTI  Plan: He will continue work with therapy on range of motion strengthening.  We will see him back in 2 weeks sooner if there is any questions or concerns particularly at abnormal warmth about the right knee increasing pain or wound breakdown.  Questions were encouraged and answered.

## 2019-08-19 ENCOUNTER — Encounter: Payer: Self-pay | Admitting: Orthopaedic Surgery

## 2019-08-19 ENCOUNTER — Other Ambulatory Visit: Payer: Self-pay

## 2019-08-19 ENCOUNTER — Ambulatory Visit (INDEPENDENT_AMBULATORY_CARE_PROVIDER_SITE_OTHER): Payer: Medicare Other | Admitting: Orthopaedic Surgery

## 2019-08-19 DIAGNOSIS — Z96651 Presence of right artificial knee joint: Secondary | ICD-10-CM

## 2019-08-19 MED ORDER — HYDROCODONE-ACETAMINOPHEN 5-325 MG PO TABS: 1.0000 | ORAL_TABLET | Freq: Four times a day (QID) | ORAL | 0 refills | Status: DC | PRN

## 2019-08-19 NOTE — Progress Notes (Signed)
Bryce Mejia is now 55 days status post a right total knee arthroplasty.  He is very active 73 years old.  He is walking without an assistive device and is driving as well.  He says his pain is getting less and he feels like his range of motion is improving.  He is going to physical therapy 3 days a week.  He has been able to flex his knee to about 108 degrees.  On examination today the knee is still warm and swollen there is no evidence of infection.  His extension is almost full in the office today and his flexion is to past 95 degrees.  I do feel he is getting there.  I do not mind refilling his hydrocodone because he is using it sparingly.  He will continue to push himself through therapy.  We would like to see him back in 4 weeks for repeat clinical exam.

## 2019-08-29 ENCOUNTER — Telehealth: Payer: Self-pay | Admitting: Orthopaedic Surgery

## 2019-08-29 NOTE — Telephone Encounter (Signed)
Faxed to provided number  

## 2019-08-29 NOTE — Telephone Encounter (Signed)
Yolonda from [facility] called. She needs a referral sent for PT for patient. Fax number [phone]. Her phone number is [phone] ext [phone]

## 2019-09-10 ENCOUNTER — Other Ambulatory Visit: Payer: Self-pay | Admitting: "Endocrinology

## 2019-09-12 ENCOUNTER — Other Ambulatory Visit: Payer: Self-pay | Admitting: "Endocrinology

## 2019-09-17 ENCOUNTER — Encounter: Payer: Self-pay | Admitting: Orthopaedic Surgery

## 2019-09-17 ENCOUNTER — Ambulatory Visit (INDEPENDENT_AMBULATORY_CARE_PROVIDER_SITE_OTHER): Payer: Medicare Other | Admitting: Orthopaedic Surgery

## 2019-09-17 ENCOUNTER — Other Ambulatory Visit: Payer: Self-pay

## 2019-09-17 ENCOUNTER — Telehealth: Payer: Self-pay | Admitting: Orthopaedic Surgery

## 2019-09-17 DIAGNOSIS — Z96651 Presence of right artificial knee joint: Secondary | ICD-10-CM

## 2019-09-17 NOTE — Telephone Encounter (Signed)
FYI

## 2019-09-17 NOTE — Progress Notes (Signed)
Bryce Mejia is now 65 days status post a right total knee arthroplasty.  He said therapy has been going well and he has been able to flex back to about 111 degrees.  His only limitation is related to the swelling.  He still reports some calf tightness.  He has had a DVT study that was negative.  He denies any foot swelling at all.  He does have compressive socks that he can wear.  He is walking without assistive device.  Overall he feels like he is doing well.  On examination his calf is soft.  His foot is not swollen.  He lacks full extension by about 3 to 5 degrees but is gotten much better and I can almost fully extend him when I push against it.  His flexion today is to 110 degrees.  At this point I do feel that he is motivated and he can transition to home exercise program.  Hopefully as the swelling goes down his range of motion will improve.  He agrees with this as well.  All question concerns were answered and addressed.  I would like to see him back in 6 months for an AP and lateral of his right knee.  If he has any issues prior to then he will let us know.

## 2019-09-17 NOTE — Telephone Encounter (Signed)
Patient stated at check out to be sure and bill it for the right knee, not the left knee because the left knee is associated with WC and medicare will deny the claim.  CB#[phone].  Thank you.

## 2019-10-02 ENCOUNTER — Other Ambulatory Visit: Payer: Self-pay | Admitting: "Endocrinology

## 2019-10-02 NOTE — Telephone Encounter (Signed)
175 mcg, note has  wrong dose.

## 2019-10-02 NOTE — Telephone Encounter (Signed)
Is he on 163mcg or 12mcg

## 2019-10-27 ENCOUNTER — Other Ambulatory Visit: Payer: Self-pay | Admitting: "Endocrinology

## 2019-11-04 ENCOUNTER — Other Ambulatory Visit: Payer: Self-pay | Admitting: "Endocrinology

## 2019-11-04 NOTE — Telephone Encounter (Signed)
Refill with 175 mcg. Thanks.

## 2019-11-04 NOTE — Telephone Encounter (Signed)
Is pt on 188mcg or 175 mcg?

## 2019-12-02 LAB — COMPLETE METABOLIC PANEL WITH GFR
AG Ratio: 1.5 (calc) (ref 1.0–2.5)
ALT: 12 U/L (ref 9–46)
AST: 18 U/L (ref 10–35)
Albumin: 3.6 g/dL (ref 3.6–5.1)
Alkaline phosphatase (APISO): 68 U/L (ref 35–144)
BUN/Creatinine Ratio: 21 (calc) (ref 6–22)
BUN: 44 mg/dL — ABNORMAL HIGH (ref 7–25)
CO2: 25 mmol/L (ref 20–32)
Calcium: 8.9 mg/dL (ref 8.6–10.3)
Chloride: 109 mmol/L (ref 98–110)
Creat: 2.07 mg/dL — ABNORMAL HIGH (ref 0.70–1.18)
GFR, Est African American: 36 mL/min/{1.73_m2} — ABNORMAL LOW (ref >=60)
GFR, Est Non African American: 31 mL/min/{1.73_m2} — ABNORMAL LOW (ref >=60)
Globulin: 2.4 g/dL (calc) (ref 1.9–3.7)
Glucose, Bld: 124 mg/dL (ref 65–139)
Potassium: 4.8 mmol/L (ref 3.5–5.3)
Sodium: 140 mmol/L (ref 135–146)
Total Bilirubin: 0.3 mg/dL (ref 0.2–1.2)
Total Protein: 6 g/dL — ABNORMAL LOW (ref 6.1–8.1)

## 2019-12-02 LAB — T4, FREE: Free T4: 1.1 ng/dL (ref 0.8–1.8)

## 2019-12-02 LAB — HEMOGLOBIN A1C
Hgb A1c MFr Bld: 6.3 % of total Hgb — ABNORMAL HIGH (ref <5.7)
Mean Plasma Glucose: 134 (calc)
eAG (mmol/L): 7.4 (calc)

## 2019-12-02 LAB — TSH: TSH: 1.11 mIU/L (ref 0.40–4.50)

## 2019-12-02 LAB — VITAMIN D 25 HYDROXY (VIT D DEFICIENCY, FRACTURES): Vit D, 25-Hydroxy: 39 ng/mL (ref 30–100)

## 2019-12-08 ENCOUNTER — Ambulatory Visit (INDEPENDENT_AMBULATORY_CARE_PROVIDER_SITE_OTHER): Payer: Medicare Other | Admitting: "Endocrinology

## 2019-12-08 ENCOUNTER — Encounter: Payer: Self-pay | Admitting: "Endocrinology

## 2019-12-08 DIAGNOSIS — E782 Mixed hyperlipidemia: Secondary | ICD-10-CM

## 2019-12-08 DIAGNOSIS — N1831 Chronic kidney disease, stage 3a: Secondary | ICD-10-CM | POA: Diagnosis not present

## 2019-12-08 DIAGNOSIS — E039 Hypothyroidism, unspecified: Secondary | ICD-10-CM

## 2019-12-08 DIAGNOSIS — E1121 Type 2 diabetes mellitus with diabetic nephropathy: Secondary | ICD-10-CM | POA: Diagnosis not present

## 2019-12-08 DIAGNOSIS — Z794 Long term (current) use of insulin: Secondary | ICD-10-CM | POA: Diagnosis not present

## 2019-12-08 DIAGNOSIS — I10 Essential (primary) hypertension: Secondary | ICD-10-CM

## 2019-12-08 NOTE — Patient Instructions (Signed)
                                     Advice for Weight Management  -For most of us the best way to lose weight is by diet management. Generally speaking, diet management means consuming less calories intentionally which over time brings about progressive weight loss.  This can be achieved more effectively by restricting carbohydrate consumption to the minimum possible.  So, it is critically important to know your numbers: how much calorie you are consuming and how much calorie you need. More importantly, our carbohydrates sources should be unprocessed or minimally processed complex starch food items.   Sometimes, it is important to balance nutrition by increasing protein intake (animal or plant source), fruits, and vegetables.  -Sticking to a routine mealtime to eat 3 meals a day and avoiding unnecessary snacks is shown to have a big role in weight control. Under normal circumstances, the only time we lose real weight is when we are hungry, so allow hunger to take place- hunger means no food between meal times, only water.  It is not advisable to starve.   -It is better to avoid simple carbohydrates including: Cakes, Sweet Desserts, Ice Cream, Soda (diet and regular), Sweet Tea, Candies, Chips, Cookies, Store Bought Juices, Alcohol in Excess of  1-2 drinks a day, Artificial Sweeteners, Doughnuts, Coffee Creamers, "Sugar-free" Products, etc, etc.  This is not a complete list.....    -Consulting with certified diabetes educators is proven to provide you with the most accurate and current information on diet.  Also, you may be  interested in discussing diet options/exchanges , we can schedule a visit with Penny Crumpton, RDN, CDE for individualized nutrition education.  -Exercise: If you are able: 30 -60 minutes a day ,4 days a week, or 150 minutes a week.  The longer the better.  Combine stretch, strength, and aerobic activities.  If you were told in the past that you  have high risk for cardiovascular diseases, you may seek evaluation by your heart doctor prior to initiating moderate to intense exercise programs.                                  Additional Care Considerations for Diabetes   -Diabetes  is a chronic disease.  The most important care consideration is regular follow-up with your diabetes care provider with the goal being avoiding or delaying its complications and to take advantage of advances in medications and technology.    -Type 2 diabetes is known to coexist with other important comorbidities such as high blood pressure and high cholesterol.  It is critical to control not only the diabetes but also the high blood pressure and high cholesterol to minimize and delay the risk of complications including coronary artery disease, stroke, amputations, blindness, etc.    - Studies showed that people with diabetes will benefit from a class of medications known as ACE inhibitors and statins.  Unless there are specific reasons not to be on these medications, the standard of care is to consider getting one from these groups of medications at an optimal doses.  These medications are generally considered safe and proven to help protect the heart and the kidneys.    - People with diabetes are encouraged to initiate and maintain regular follow-up with eye doctors, foot doctors, dentists ,   and if necessary heart and kidney doctors.     - It is highly recommended that people with diabetes quit smoking or stay away from smoking, and get yearly  flu vaccine and pneumonia vaccine at least every 5 years.  One other important lifestyle recommendation is to ensure adequate sleep - at least 6-7 hours of uninterrupted sleep at night.  -Exercise: If you are able: 30 -60 minutes a day, 4 days a week, or 150 minutes a week.  The longer the better.  Combine stretch, strength, and aerobic activities.  If you were told in the past that you have high risk for cardiovascular  diseases, you may seek evaluation by your heart doctor prior to initiating moderate to intense exercise programs.     COVID-19 Vaccine Information can be found at: [email or web address] For questions related to vaccine distribution or appointments, please email [email or web address] or call [phone].        

## 2019-12-08 NOTE — Progress Notes (Signed)
12/08/2019                                                    Endocrinology Telehealth Visit Follow up Note -During COVID -19 Pandemic  This visit type was conducted due to national recommendations for restrictions regarding the COVID-19 Pandemic  in an effort to limit this patient's exposure and mitigate transmission of the corona virus.  Due to his co-morbid illnesses, Bryce Mejia is at  moderate to high risk for complications without adequate follow up.  This format is felt to be most appropriate for him at this time.  I connected with this patient on 12/08/2019   by telephone and verified that I am speaking with the correct person using two identifiers. Florenz Gort, 74-Jun-1947. he has verbally consented to this visit. All issues noted in this document were discussed and addressed. The format was not optimal for physical exam.    Subjective:    Patient ID: Bryce Mejia, male    DOB: 1946/08/16.  he is being engaged in telehealth via telephone for follow-up visit in the management of currently uncontrolled symptomatic type 2 diabetes, hypothyroidism, hyperlipidemia.   PMD:  Roderic Scarce, MD.   Past Medical History:  Diagnosis Date  . Chronic kidney disease, stage 3    CKD stage 3  . Diabetes mellitus, type II ([place])   . Family history of adverse reaction to anesthesia    Mom- difficult to wake up from anesthesia  . H/O atrial flutter    s/p DCCV 08/01/17  . Heart attack ([place])   . Sleep apnea    Past Surgical History:  Procedure Laterality Date  . ABLATION SAPHENOUS VEIN W/ RFA     Saphenous vein endovascular radiofrequency ablation: right smaller saphenous vein 03/13/16 and left small spahenous vein 03/27/16; left GSV RFA 01/08/15, right GSA RFA 01/22/15 ([place])  . CARDIAC CATHETERIZATION     04/17/14 ([place]): normal LM, 50% mid LAD, multiple 40-50% distal LAD lesions, occluded mid CX, s/p PROMUS stent, 80% ostial AV branch, long 50% stenosis proximal  RCA-mid RCA, 25-30% PDA. Medical tx for LAD and RCA disease Cleora Fleet, MD)  . CARPAL TUNNEL RELEASE    . NOSE SURGERY    . REPLACEMENT TOTAL KNEE    . TOTAL KNEE ARTHROPLASTY Right 06/24/2019  . TOTAL KNEE ARTHROPLASTY Right 06/24/2019   Procedure: RIGHT TOTAL KNEE ARTHROPLASTY;  Surgeon: Mcarthur Rossetti, MD;  Location: [facility];  Service: Orthopedics;  Laterality: Right;   Social History   Socioeconomic History  . Marital status: Widowed    Spouse name: Not on file  . Number of children: Not on file  . Years of education: Not on file  . Highest education level: Not on file  Occupational History  . Not on file  Tobacco Use  . Smoking status: Former Smoker    Quit date: 08/16/2007    Years since quitting: 12.3  . Smokeless tobacco: Never Used  Substance and Sexual Activity  . Alcohol use: Yes    Comment: Occassional  . Drug use: No  . Sexual activity: Not on file  Other Topics Concern  . Not on file  Social History Narrative  . Not on file   Social Determinants of Health   Financial Resource Strain:   . Difficulty of Paying Living  Expenses: Not on file  Food Insecurity:   . Worried About Charity fundraiser in the Last Year: Not on file  . Ran Out of Food in the Last Year: Not on file  Transportation Needs:   . Lack of Transportation (Medical): Not on file  . Lack of Transportation (Non-Medical): Not on file  Physical Activity:   . Days of Exercise per Week: Not on file  . Minutes of Exercise per Session: Not on file  Stress:   . Feeling of Stress : Not on file  Social Connections:   . Frequency of Communication with Friends and Family: Not on file  . Frequency of Social Gatherings with Friends and Family: Not on file  . Attends Religious Services: Not on file  . Active Member of Clubs or Organizations: Not on file  . Attends Archivist Meetings: Not on file  . Marital Status: Not on file   Outpatient Encounter Medications as of 12/08/2019   Medication Sig  . apixaban (ELIQUIS) 5 MG TABS tablet Take 5 mg by mouth 2 (two) times daily.  Marland Kitchen aspirin EC 81 MG tablet Take 81 mg by mouth daily.  . cetirizine (ZYRTEC) 10 MG tablet Take 10 mg by mouth daily.  . Cholecalciferol (VITAMIN D) 2000 units CAPS Take 2,000 Units by mouth daily.   . ciprofloxacin (CIPRO) 500 MG tablet Take 500 mg by mouth 2 (two) times daily.  . clonazePAM (KLONOPIN) 0.5 MG tablet Take 0.25-0.5 mg by mouth 2 (two) times daily as needed for anxiety.   . docusate sodium (COLACE) 100 MG capsule Take 100 mg by mouth 2 (two) times daily.  . EUTHYROX 175 MCG tablet TAKE 1 TABLET BY MOUTH ONCE DAILY BEFORE BREAKFAST  . finasteride (PROSCAR) 5 MG tablet Take 5 mg by mouth daily.  . Flaxseed, Linseed, (FLAXSEED OIL PO) Take 1 capsule by mouth daily.   . fluticasone (FLONASE) 50 MCG/ACT nasal spray Place 2 sprays into both nostrils daily as needed for allergies or rhinitis.   . furosemide (LASIX) 20 MG tablet Take 20 mg by mouth every other day.  Marland Kitchen HYDROcodone-acetaminophen (NORCO) 5-325 MG tablet Take 1 tablet by mouth every 6 (six) hours as needed for moderate pain. One to two tabs every 4-6 hours for pain  . lisinopril (PRINIVIL,ZESTRIL) 10 MG tablet Take 10 mg by mouth daily.  . methocarbamol (ROBAXIN) 500 MG tablet Take 1 tablet (500 mg total) by mouth every 6 (six) hours as needed for muscle spasms.  . Multiple Vitamins-Minerals (OCUVITE ADULT 50+ PO) Take 1 tablet by mouth daily.   Marland Kitchen NOVOLIN 70/30 RELION (70-30) 100 UNIT/ML injection INJECT 15 UNITS SUBCUTANEOUSLY TWICE DAILY WITH MEALS  . pravastatin (PRAVACHOL) 20 MG tablet Take 20 mg by mouth every other day.   . terazosin (HYTRIN) 10 MG capsule Take 10 mg by mouth at bedtime.  . traZODone (DESYREL) 100 MG tablet Take 50 mg by mouth at bedtime.   . [DISCONTINUED] levothyroxine (SYNTHROID) 175 MCG tablet Take 175 mcg by mouth daily before breakfast.  . [DISCONTINUED] NOVOLIN 70/30 RELION (70-30) 100 UNIT/ML  injection INJECT 15 UNITS SUBCUTANEOUSLY TWICE DAILY WITH MEALS (Patient taking differently: Inject 15 Units into the skin 2 (two) times daily with a meal. )  . [DISCONTINUED] oxyCODONE (OXY IR/ROXICODONE) 5 MG immediate release tablet Take 1-2 tablets (5-10 mg total) by mouth every 4 (four) hours as needed for moderate pain (pain score 4-6).   No facility-administered encounter medications on file as of 12/08/2019.  ALLERGIES: No Known Allergies  VACCINATION STATUS:  There is no immunization history on file for this patient.  Diabetes He presents for his follow-up diabetic visit. He has type 2 diabetes mellitus. Onset time: He was diagnosed at approximate age of 35 years. His disease course has been improving. There are no hypoglycemic associated symptoms. Pertinent negatives for hypoglycemia include no confusion, headaches, pallor or seizures. Pertinent negatives for diabetes include no chest pain, no fatigue, no polydipsia, no polyphagia, no polyuria and no weakness. There are no hypoglycemic complications. Symptoms are improving. Diabetic complications include heart disease and nephropathy. Risk factors for coronary artery disease include dyslipidemia, diabetes mellitus, hypertension, male sex, obesity, sedentary lifestyle and tobacco exposure. Current diabetic treatment includes insulin injections and oral agent (monotherapy) (He is currently on Novolin N 20 units twice a day, NovoLog 3-12 units 3 times a day before meals, glipizide 10 mg by mouth twice a day.). His weight is decreasing steadily. He is following a generally unhealthy diet. When asked about meal planning, he reported none. He has not had a previous visit with a dietitian. He never participates in exercise. His breakfast blood glucose range is generally 130-140 mg/dl. His bedtime blood glucose range is generally 130-140 mg/dl. His overall blood glucose range is 130-140 mg/dl. (He reports fasting blood glucose ranging between the  97-1 30, postprandial blood glucose ranging between 129-145.  His A1c is 6.3%, stable.) An ACE inhibitor/angiotensin II receptor blocker is being taken.  Hyperlipidemia This is a chronic problem. The current episode started more than 1 year ago. The problem is uncontrolled. Exacerbating diseases include diabetes, hypothyroidism and obesity. Pertinent negatives include no chest pain, myalgias or shortness of breath. Current antihyperlipidemic treatment includes statins. Risk factors for coronary artery disease include diabetes mellitus, dyslipidemia, hypertension, male sex, obesity and a sedentary lifestyle.  Hypertension This is a chronic problem. The current episode started more than 1 year ago. Pertinent negatives include no chest pain, headaches, neck pain, palpitations or shortness of breath. Risk factors for coronary artery disease include dyslipidemia, diabetes mellitus, male gender, obesity, sedentary lifestyle and smoking/tobacco exposure. Hypertensive end-organ damage includes kidney disease, CAD/MI and heart failure.   Review of systems: Limited as above.   Objective:    There were no vitals taken for this visit.  Wt Readings from Last 3 Encounters:  06/26/19 253 lb 8.5 oz (115 kg)  06/20/19 255 lb 4.8 oz (115.8 kg)  06/09/19 255 lb 6.4 oz (115.8 kg)       Recent Results (from the past 2160 hour(s))  TSH     Status: None   Collection Time: 12/01/19  3:14 PM  Result Value Ref Range   TSH 1.11 0.40 - 4.50 mIU/L  T4, free     Status: None   Collection Time: 12/01/19  3:14 PM  Result Value Ref Range   Free T4 1.1 0.8 - 1.8 ng/dL  Hemoglobin A1c     Status: Abnormal   Collection Time: 12/01/19  3:14 PM  Result Value Ref Range   Hgb A1c MFr Bld 6.3 (H) <5.7 % of total Hgb    Comment: For someone without known diabetes, a hemoglobin  A1c value between 5.7% and 6.4% is consistent with prediabetes and should be confirmed with a  follow-up test. . For someone with known  diabetes, a value <7% indicates that their diabetes is well controlled. A1c targets should be individualized based on duration of diabetes, age, comorbid conditions, and other considerations. . This assay result is  consistent with an increased risk of diabetes. . Currently, no consensus exists regarding use of hemoglobin A1c for diagnosis of diabetes for children. .    Mean Plasma Glucose 134 (calc)   eAG (mmol/L) 7.4 (calc)  COMPLETE METABOLIC PANEL WITH GFR     Status: Abnormal   Collection Time: 12/01/19  3:14 PM  Result Value Ref Range   Glucose, Bld 124 65 - 139 mg/dL    Comment: .        Non-fasting reference interval .    BUN 44 (H) 7 - 25 mg/dL   Creat 2.07 (H) 0.70 - 1.18 mg/dL    Comment: For patients >51 years of age, the reference limit for Creatinine is approximately 13% higher for people identified as African-American. .    GFR, Est Non African American 31 (L) > OR = 60 mL/min/1.73m2   GFR, Est African American 36 (L) > OR = 60 mL/min/1.52m2   BUN/Creatinine Ratio 21 6 - 22 (calc)   Sodium 140 135 - 146 mmol/L   Potassium 4.8 3.5 - 5.3 mmol/L   Chloride 109 98 - 110 mmol/L   CO2 25 20 - 32 mmol/L   Calcium 8.9 8.6 - 10.3 mg/dL   Total Protein 6.0 (L) 6.1 - 8.1 g/dL   Albumin 3.6 3.6 - 5.1 g/dL   Globulin 2.4 1.9 - 3.7 g/dL (calc)   AG Ratio 1.5 1.0 - 2.5 (calc)   Total Bilirubin 0.3 0.2 - 1.2 mg/dL   Alkaline phosphatase (APISO) 68 35 - 144 U/L   AST 18 10 - 35 U/L   ALT 12 9 - 46 U/L  VITAMIN D 25 Hydroxy (Vit-D Deficiency, Fractures)     Status: None   Collection Time: 12/01/19  3:14 PM  Result Value Ref Range   Vit D, 25-Hydroxy 39 30 - 100 ng/mL    Comment: Vitamin D Status         25-OH Vitamin D: . Deficiency:                    <20 ng/mL Insufficiency:             20 - 29 ng/mL Optimal:                 > or = 30 ng/mL . For 25-OH Vitamin D testing on patients on  D2-supplementation and patients for whom quantitation  of D2 and D3  fractions is required, the [facility](TM) 25-OH VIT D, (D2,D3), LC/MS/MS is recommended: order  code [phone] (patients >5yrs). See Note 1 . Note 1 . For additional information, please refer to  http://education.[facility].com/faq/FAQ199  (This link is being provided for informational/ educational purposes only.)      Assessment & Plan:   1. DM type 2 causing vascular disease ([place]) - Patient has currently uncontrolled symptomatic type 2 DM since  74 years of age. -He reports controlled glycemic profile, and A1c of 6.3% overall improving from 9.5%.    -his diabetes is complicated by coronary artery disease, CKD, obesity/sedentary life and Kinan Giachetti remains at a high risk for more acute and chronic complications which include CAD, CVA, CKD, retinopathy, and neuropathy. These are all discussed in detail with the patient.  - he  admits there is a room for improvement in his diet and drink choices. -  Suggestion is made for him to avoid simple carbohydrates  from his diet including Cakes, Sweet Desserts / Pastries, Ice Cream, Soda (diet and regular), Sweet Tea,  Candies, Chips, Cookies, Sweet Pastries,  Store Bought Juices, Alcohol in Excess of  1-2 drinks a day, Artificial Sweeteners, Coffee Creamer, and "Sugar-free" Products. This will help patient to have stable blood glucose profile and potentially avoid unintended weight gain.   -He has done very well on next insulin Novolin 70/30.   He is advised to continue  Novolin  70/30, 15 units with breakfast and 15 units with supper for pre-meal blood glucose readings above 90 mg/dL, associated with strict monitoring of blood glucose 2 times a day-before breakfast and before supper.  - Patient is warned not to take insulin without proper monitoring per orders.  -He is encouraged to call clinic for blood glucose levels less than 70 or above 300 mg /dl.  -He  is not a candidate for  SGLT2 inhibitors due to CKD.  He is advised to  discontinue naproxen he is taking for pain which may worsen his renal function. - he will be considered for incretin therapy as appropriate next visit.   2) Lipids/HPL: His most recent lipid panel showed controlled LDL at 48 improving from 90.  He is advised to continue pravastatin 20 mg p.o. nightly.     3) hypothyroidism: - His previsit thyroid function tests are consistent with appropriate replacement.  -He is advised to continue levothyroxine 175 mcg p.o. daily before breakfast    - We discussed about the correct intake of his thyroid hormone, on empty stomach at fasting, with water, separated by at least 30 minutes from breakfast and other medications,  and separated by more than 4 hours from calcium, iron, multivitamins, acid reflux medications (PPIs). -Patient is made aware of the fact that thyroid hormone replacement is needed for life, dose to be adjusted by periodic monitoring of thyroid function tests.   - I advised patient to maintain close follow up with Roderic Scarce, MD for primary care needs.  - Time spent on this patient care encounter:  35 min, of which >50% was spent in  counseling and the rest reviewing his  current and  previous labs/studies ( including abstraction from other facilities),  previous treatments, his blood glucose readings, and medications' doses and developing a plan for long-term care based on the latest recommendations for standards of care; and documenting his care.  Serenity Rapa participated in the discussions, expressed understanding, and voiced agreement with the above plans.  All questions were answered to his satisfaction. he is encouraged to contact clinic should he have any questions or concerns prior to his return visit.   Follow up plan: - Return in about 4 months (around 04/06/2020) for Bring Meter and Logs- A1c in Office.  Glade Lloyd, MD Phone: [phone]  Fax: [phone]   12/08/2019, 12:51 PM This note was partially dictated with  voice recognition software. Similar sounding words can be transcribed inadequately or may not  be corrected upon review.

## 2020-01-25 ENCOUNTER — Other Ambulatory Visit: Payer: Self-pay | Admitting: "Endocrinology

## 2020-02-23 ENCOUNTER — Other Ambulatory Visit: Payer: Self-pay | Admitting: "Endocrinology

## 2020-03-16 ENCOUNTER — Ambulatory Visit (INDEPENDENT_AMBULATORY_CARE_PROVIDER_SITE_OTHER): Payer: Medicare Other

## 2020-03-16 ENCOUNTER — Other Ambulatory Visit: Payer: Self-pay

## 2020-03-16 ENCOUNTER — Encounter: Payer: Self-pay | Admitting: Orthopaedic Surgery

## 2020-03-16 ENCOUNTER — Ambulatory Visit (INDEPENDENT_AMBULATORY_CARE_PROVIDER_SITE_OTHER): Payer: Medicare Other | Admitting: Orthopaedic Surgery

## 2020-03-16 DIAGNOSIS — M25512 Pain in left shoulder: Secondary | ICD-10-CM | POA: Diagnosis not present

## 2020-03-16 DIAGNOSIS — Z96651 Presence of right artificial knee joint: Secondary | ICD-10-CM | POA: Diagnosis not present

## 2020-03-16 NOTE — Progress Notes (Signed)
Office Visit Note   Patient: Bryce Mejia           Date of Birth: Feb 15, 1946           MRN: [id number] Visit Date: 03/16/2020              Requested by: Roderic Scarce, MD [address] [place] [address] PCP: Roderic Scarce, MD   Assessment & Plan: Visit Diagnoses:  1. History of total right knee replacement   2. Acute pain of left shoulder     Plan: At this point since he is doing so well he can follow-up as needed for his right knee.  Also with his left shoulder he can follow-up as needed.  He understands that the shoulder worsens in any way we recommend an arthroscopic intervention with a subacromial decompression and a AC joint resection/distal clavicle resection.  But this is only if it becomes symptomatic and problematic for him.  All questions and concerns were answered and addressed.  Follow-Up Instructions: Return if symptoms worsen or fail to improve.   Orders:  Orders Placed This Encounter  Procedures  . XR Knee 1-2 Views Right  . XR Shoulder 1V Left   No orders of the defined types were placed in this encounter.     Procedures: No procedures performed   Clinical Data: No additional findings.   Subjective: Chief Complaint  Patient presents with  . Right Knee - Follow-up  The patient comes in today getting close to 9 months status post a right total knee arthroplasty.  He has a history of a left total knee that then was revised.  This was done elsewhere.  Both knees are doing well.  He still has some swelling in his right knee and he understands that is to be expected for a while postoperative treatment up to a year.  He has no complaints of instability of the knee.  He said he still work on range of motion and strength.  He does need 1 more refill of hydrocodone.  He also let me know that he is having some left shoulder issues and has developed a mass at the top of his shoulder has been going on for 6 months and getting more sore.  He is  having pain with overhead activities and reaching behind him with that left shoulder.  He is never had surgery on that shoulder or injured that shoulder that he is aware of with the left shoulder.  HPI  Review of Systems He currently denies any headache, chest pain, shortness of breath, fever, chills, nausea, vomiting  Objective: Vital Signs: There were no vitals taken for this visit.  Physical Exam He is alert and orient x3 and in no acute distress Ortho Exam Examination of his right operative knee shows a well-healed surgical incision.  There is only mild swelling about the knee.  His extension is full and his flexion is to about 100 to 110 degrees.  Some of this is limited by his weight.  Examination of his left shoulder does show a painful palpable mass at the superior aspect of the shoulder just medial to the [facility] joint.  There is no redness her skin induration.  It is painful to palpation but not mobile. Specialty Comments:  No specialty comments available.  Imaging: XR Knee 1-2 Views Right  Result Date: 03/16/2020 2 views of the right knee show a total knee arthroplasty with no evidence of loosening or complicating features.  XR Shoulder 1V Left  Result Date: 03/16/2020 A single AP view of the left shoulder shows severe osteoarthritis of the AC joint.    PMFS History: Patient Active Problem List   Diagnosis Date Noted  . Status post total right knee replacement 06/24/2019  . Unilateral primary osteoarthritis, right knee 03/17/2019  . Type 2 diabetes mellitus with stage 3a chronic kidney disease, with long-term current use of insulin ([place]) 01/17/2018  . DM type 2 causing vascular disease ([place]) 08/15/2017  . Type 2 diabetes mellitus with chronic kidney disease, with long-term current use of insulin ([place]) 08/15/2017  . Essential hypertension, benign 08/15/2017  . Mixed hyperlipidemia 08/15/2017  . Hypothyroidism 08/15/2017  . Morbid obesity ([place]) 08/15/2017   Past Medical  History:  Diagnosis Date  . Chronic kidney disease, stage 3    CKD stage 3  . Diabetes mellitus, type II ([place])   . Family history of adverse reaction to anesthesia    Mom- difficult to wake up from anesthesia  . H/O atrial flutter    s/p DCCV 08/01/17  . Heart attack ([place])   . Sleep apnea     History reviewed. No pertinent family history.  Past Surgical History:  Procedure Laterality Date  . ABLATION SAPHENOUS VEIN W/ RFA     Saphenous vein endovascular radiofrequency ablation: right smaller saphenous vein 03/13/16 and left small spahenous vein 03/27/16; left GSV RFA 01/08/15, right GSA RFA 01/22/15 ([place])  . CARDIAC CATHETERIZATION     04/17/14 ([place]): normal LM, 50% mid LAD, multiple 40-50% distal LAD lesions, occluded mid CX, s/p PROMUS stent, 80% ostial AV branch, long 50% stenosis proximal RCA-mid RCA, 25-30% PDA. Medical tx for LAD and RCA disease Cleora Fleet, MD)  . CARPAL TUNNEL RELEASE    . NOSE SURGERY    . REPLACEMENT TOTAL KNEE    . TOTAL KNEE ARTHROPLASTY Right 06/24/2019  . TOTAL KNEE ARTHROPLASTY Right 06/24/2019   Procedure: RIGHT TOTAL KNEE ARTHROPLASTY;  Surgeon: Mcarthur Rossetti, MD;  Location: [facility];  Service: Orthopedics;  Laterality: Right;   Social History   Occupational History  . Not on file  Tobacco Use  . Smoking status: Former Smoker    Quit date: 08/16/2007    Years since quitting: 12.5  . Smokeless tobacco: Never Used  Substance and Sexual Activity  . Alcohol use: Yes    Comment: Occassional  . Drug use: No  . Sexual activity: Not on file

## 2020-03-25 ENCOUNTER — Telehealth: Payer: Self-pay | Admitting: Orthopaedic Surgery

## 2020-03-25 ENCOUNTER — Other Ambulatory Visit: Payer: Self-pay | Admitting: Orthopaedic Surgery

## 2020-03-25 MED ORDER — HYDROCODONE-ACETAMINOPHEN 5-325 MG PO TABS: 1.0000 | ORAL_TABLET | Freq: Four times a day (QID) | ORAL | 0 refills | Status: AC | PRN

## 2020-03-25 NOTE — Telephone Encounter (Signed)
Please advise 

## 2020-03-25 NOTE — Telephone Encounter (Signed)
Patient called advised his Rx is not showing at the pharmacy yet (Hydrocodone) The number to contact patient is [phone]

## 2020-03-25 NOTE — Telephone Encounter (Signed)
I sent some in just now.

## 2020-04-08 ENCOUNTER — Other Ambulatory Visit: Payer: Self-pay

## 2020-04-08 ENCOUNTER — Encounter: Payer: Self-pay | Admitting: "Endocrinology

## 2020-04-08 ENCOUNTER — Ambulatory Visit (INDEPENDENT_AMBULATORY_CARE_PROVIDER_SITE_OTHER): Payer: Medicare Other | Admitting: "Endocrinology

## 2020-04-08 VITALS — BP 162/74 | HR 64 | Ht 68.5 in | Wt 262.4 lb

## 2020-04-08 DIAGNOSIS — Z794 Long term (current) use of insulin: Secondary | ICD-10-CM

## 2020-04-08 DIAGNOSIS — E039 Hypothyroidism, unspecified: Secondary | ICD-10-CM | POA: Diagnosis not present

## 2020-04-08 DIAGNOSIS — E1122 Type 2 diabetes mellitus with diabetic chronic kidney disease: Secondary | ICD-10-CM

## 2020-04-08 DIAGNOSIS — I10 Essential (primary) hypertension: Secondary | ICD-10-CM

## 2020-04-08 DIAGNOSIS — E1121 Type 2 diabetes mellitus with diabetic nephropathy: Secondary | ICD-10-CM | POA: Diagnosis not present

## 2020-04-08 DIAGNOSIS — N1831 Chronic kidney disease, stage 3a: Secondary | ICD-10-CM

## 2020-04-08 DIAGNOSIS — E782 Mixed hyperlipidemia: Secondary | ICD-10-CM

## 2020-04-08 LAB — POCT GLYCOSYLATED HEMOGLOBIN (HGB A1C): Hemoglobin A1C: 6.2 % — AB (ref 4.0–5.6)

## 2020-04-08 MED ORDER — LISINOPRIL-HYDROCHLOROTHIAZIDE 10-12.5 MG PO TABS
1.0000 | ORAL_TABLET | Freq: Every day | ORAL | 3 refills | Status: DC
Start: 2020-04-08 — End: 2020-08-10

## 2020-04-08 NOTE — Patient Instructions (Signed)

## 2020-04-08 NOTE — Progress Notes (Signed)
04/08/2020  Endocrinology follow-up note  Subjective:    Patient ID: Bryce Mejia, male    DOB: 27-Aug-1946.  he is being seen in follow-up  in the management of currently uncontrolled symptomatic type 2 diabetes, hypothyroidism, hyperlipidemia, hypertension.   PMD:  Roderic Scarce, MD.   Past Medical History:  Diagnosis Date  . Chronic kidney disease, stage 3    CKD stage 3  . Diabetes mellitus, type II ([place])   . Family history of adverse reaction to anesthesia    Mom- difficult to wake up from anesthesia  . H/O atrial flutter    s/p DCCV 08/01/17  . Heart attack ([place])   . Sleep apnea    Past Surgical History:  Procedure Laterality Date  . ABLATION SAPHENOUS VEIN W/ RFA     Saphenous vein endovascular radiofrequency ablation: right smaller saphenous vein 03/13/16 and left small spahenous vein 03/27/16; left GSV RFA 01/08/15, right GSA RFA 01/22/15 ([place])  . CARDIAC CATHETERIZATION     04/17/14 ([facility]): normal LM, 50% mid LAD, multiple 40-50% distal LAD lesions, occluded mid CX, s/p PROMUS stent, 80% ostial AV branch, long 50% stenosis proximal RCA-mid RCA, 25-30% PDA. Medical tx for LAD and RCA disease Cleora Fleet, MD)  . CARPAL TUNNEL RELEASE    . NOSE SURGERY    . REPLACEMENT TOTAL KNEE    . TOTAL KNEE ARTHROPLASTY Right 06/24/2019  . TOTAL KNEE ARTHROPLASTY Right 06/24/2019   Procedure: RIGHT TOTAL KNEE ARTHROPLASTY;  Surgeon: Mcarthur Rossetti, MD;  Location: [facility];  Service: Orthopedics;  Laterality: Right;   Social History   Socioeconomic History  . Marital status: Widowed    Spouse name: Not on file  . Number of children: Not on file  . Years of education: Not on file  . Highest education level: Not on file  Occupational History  . Not on file  Tobacco Use  . Smoking status: Former Smoker    Quit date: 08/16/2007    Years since quitting: 12.6  . Smokeless tobacco: Never Used  Substance and Sexual Activity  . Alcohol use: Yes     Comment: Occassional  . Drug use: No  . Sexual activity: Not on file  Other Topics Concern  . Not on file  Social History Narrative  . Not on file   Social Determinants of Health   Financial Resource Strain:   . Difficulty of Paying Living Expenses:   Food Insecurity:   . Worried About Charity fundraiser in the Last Year:   . Arboriculturist in the Last Year:   Transportation Needs:   . Film/video editor (Medical):   Marland Kitchen Lack of Transportation (Non-Medical):   Physical Activity:   . Days of Exercise per Week:   . Minutes of Exercise per Session:   Stress:   . Feeling of Stress :   Social Connections:   . Frequency of Communication with Friends and Family:   . Frequency of Social Gatherings with Friends and Family:   . Attends Religious Services:   . Active Member of Clubs or Organizations:   . Attends Archivist Meetings:   Marland Kitchen Marital Status:    Outpatient Encounter Medications as of 04/08/2020  Medication Sig  . apixaban (ELIQUIS) 5 MG TABS tablet Take 5 mg by mouth 2 (two) times daily.  Marland Kitchen aspirin EC 81 MG tablet Take 81 mg by mouth daily.  . cetirizine (ZYRTEC) 10 MG tablet Take 10 mg  by mouth daily.  . Cholecalciferol (VITAMIN D) 2000 units CAPS Take 2,000 Units by mouth daily.   . ciprofloxacin (CIPRO) 500 MG tablet Take 500 mg by mouth 2 (two) times daily.  . clonazePAM (KLONOPIN) 0.5 MG tablet Take 0.25-0.5 mg by mouth 2 (two) times daily as needed for anxiety.   . docusate sodium (COLACE) 100 MG capsule Take 100 mg by mouth 2 (two) times daily.  . EUTHYROX 175 MCG tablet TAKE 1 TABLET BY MOUTH ONCE DAILY BEFORE BREAKFAST  . finasteride (PROSCAR) 5 MG tablet Take 5 mg by mouth daily.  . fluticasone (FLONASE) 50 MCG/ACT nasal spray Place 2 sprays into both nostrils daily as needed for allergies or rhinitis.   . furosemide (LASIX) 20 MG tablet Take 20 mg by mouth every other day.  Marland Kitchen HYDROcodone-acetaminophen (NORCO) 5-325 MG tablet Take 1 tablet by mouth  every 6 (six) hours as needed for moderate pain. One to two tabs every 4-6 hours for pain  . lisinopril (PRINIVIL,ZESTRIL) 10 MG tablet Take 10 mg by mouth daily.  Marland Kitchen lisinopril-hydrochlorothiazide (ZESTORETIC) 10-12.5 MG tablet Take 1 tablet by mouth daily.  . methocarbamol (ROBAXIN) 500 MG tablet Take 1 tablet (500 mg total) by mouth every 6 (six) hours as needed for muscle spasms.  . Multiple Vitamins-Minerals (OCUVITE ADULT 50+ PO) Take 1 tablet by mouth daily.   Marland Kitchen NOVOLIN 70/30 RELION (70-30) 100 UNIT/ML injection INJECT 15 UNITS SUBCUTANEOUSLY TWICE DAILY WITH MEALS  . pravastatin (PRAVACHOL) 20 MG tablet Take 20 mg by mouth every other day.   . terazosin (HYTRIN) 10 MG capsule Take 10 mg by mouth at bedtime.  . traZODone (DESYREL) 100 MG tablet Take 50 mg by mouth at bedtime.   . [DISCONTINUED] Flaxseed, Linseed, (FLAXSEED OIL PO) Take 1 capsule by mouth daily.    No facility-administered encounter medications on file as of 04/08/2020.    ALLERGIES: No Known Allergies  VACCINATION STATUS:  There is no immunization history on file for this patient.  Diabetes He presents for his follow-up diabetic visit. He has type 2 diabetes mellitus. Onset time: He was diagnosed at approximate age of 30 years. His disease course has been improving. There are no hypoglycemic associated symptoms. Pertinent negatives for hypoglycemia include no confusion, headaches, pallor or seizures. Pertinent negatives for diabetes include no chest pain, no fatigue, no polydipsia, no polyphagia, no polyuria and no weakness. There are no hypoglycemic complications. Symptoms are improving. Diabetic complications include heart disease and nephropathy. Risk factors for coronary artery disease include dyslipidemia, diabetes mellitus, hypertension, male sex, obesity, sedentary lifestyle and tobacco exposure. Current diabetic treatment includes insulin injections and oral agent (monotherapy) (He is currently on Novolin N 20  units twice a day, NovoLog 3-12 units 3 times a day before meals, glipizide 10 mg by mouth twice a day.). He is compliant with treatment all of the time. His weight is decreasing steadily. He is following a generally unhealthy diet. When asked about meal planning, he reported none. He has not had a previous visit with a dietitian. He never participates in exercise. His home blood glucose trend is fluctuating minimally. His breakfast blood glucose range is generally 130-140 mg/dl. His bedtime blood glucose range is generally 130-140 mg/dl. His overall blood glucose range is 130-140 mg/dl. (He reports fasting blood glucose ranging between the 97-1 30, postprandial blood glucose ranging between 129-145.  His A1c is 6.3%, stable.) An ACE inhibitor/angiotensin II receptor blocker is being taken.  Hyperlipidemia This is a chronic problem. The  current episode started more than 1 year ago. The problem is uncontrolled. Exacerbating diseases include diabetes, hypothyroidism and obesity. Pertinent negatives include no chest pain, myalgias or shortness of breath. Current antihyperlipidemic treatment includes statins. Risk factors for coronary artery disease include diabetes mellitus, dyslipidemia, hypertension, male sex, obesity and a sedentary lifestyle.  Hypertension This is a chronic problem. The current episode started more than 1 year ago. Pertinent negatives include no chest pain, headaches, neck pain, palpitations or shortness of breath. Risk factors for coronary artery disease include dyslipidemia, diabetes mellitus, male gender, obesity, sedentary lifestyle and smoking/tobacco exposure. Hypertensive end-organ damage includes kidney disease, CAD/MI and heart failure.    Review of systems  Constitutional: + Minimally fluctuating body weight,  current  Body mass index is 39.32 kg/m. , no fatigue, no subjective hyperthermia, no subjective hypothermia Eyes: no blurry vision, no xerophthalmia ENT: no sore throat,  no nodules palpated in throat, no dysphagia/odynophagia, no hoarseness Cardiovascular: no Chest Pain, no Shortness of Breath, no palpitations, no leg swelling Respiratory: no cough, no shortness of breath Gastrointestinal: no Nausea/Vomiting/Diarhhea Musculoskeletal: no muscle/joint aches Skin: no rashes, no hyperemia Neurological: no tremors, no numbness, no tingling, no dizziness Psychiatric: no depression, no anxiety    Objective:    BP (!) 162/74   Pulse 64   Ht 5' 8.5" (1.74 m)   Wt 262 lb 6.4 oz (119 kg)   BMI 39.32 kg/m   Wt Readings from Last 3 Encounters:  04/08/20 262 lb 6.4 oz (119 kg)  06/26/19 253 lb 8.5 oz (115 kg)  06/20/19 255 lb 4.8 oz (115.8 kg)     Physical Exam- Limited  Constitutional:  Body mass index is 39.32 kg/m. , not in acute distress, normal state of mind Eyes:  EOMI, no exophthalmos Neck: Supple Thyroid: No gross goiter Respiratory: Adequate breathing efforts Musculoskeletal: no gross deformities, strength intact in all four extremities, no gross restriction of joint movements, + foot exam is normal. Skin:  no rashes, no hyperemia Neurological: no tremor with outstretched hands,     Lipid Panel     Component Value Date/Time   CHOL 104 05/13/2018 1554   TRIG 100 05/13/2018 1554   HDL 37 (L) 05/13/2018 1554   CHOLHDL 2.8 05/13/2018 1554   LDLCALC 48 05/13/2018 1554   CMP Latest Ref Rng & Units 12/01/2019 06/25/2019 06/20/2019  Glucose 65 - 139 mg/dL 124 173(H) 144(H)  BUN 7 - 25 mg/dL 44(H) 40(H) 33(H)  Creatinine 0.70 - 1.18 mg/dL 2.07(H) 2.14(H) 2.10(H)  Sodium 135 - 146 mmol/L 140 136 140  Potassium 3.5 - 5.3 mmol/L 4.8 5.1 5.1  Chloride 98 - 110 mmol/L 109 106 110  CO2 20 - 32 mmol/L 25 20(L) 23  Calcium 8.6 - 10.3 mg/dL 8.9 8.2(L) 9.3  Total Protein 6.1 - 8.1 g/dL 6.0(L) - -  Total Bilirubin 0.2 - 1.2 mg/dL 0.3 - -  AST 10 - 35 U/L 18 - -  ALT 9 - 46 U/L 12 - -   Results for KASSIDY, FRANKSON (MRN [id number]) as of 04/08/2020  10:12  Ref. Range 12/01/2019 15:14  eAG (mmol/L) Latest Units: (calc) 7.4  Glucose Latest Ref Range: 65 - 139 mg/dL 124  Hemoglobin A1C Latest Ref Range: <5.7 % of total Hgb 6.3 (H)  TSH Latest Ref Range: 0.40 - 4.50 mIU/L 1.11  T4,Free(Direct) Latest Ref Range: 0.8 - 1.8 ng/dL 1.1    Assessment & Plan:   1. DM type 2 causing vascular disease ([place]) - Patient has  currently uncontrolled symptomatic type 2 DM since  74 years of age. -He presents with near target glycemic profile, and point-of-care A1c of 6.2% overall improving from 9.5%.   He did not document or report hypoglycemia.  -his diabetes is complicated by coronary artery disease, CKD, obesity/sedentary life and Bryce Mejia remains at a high risk for more acute and chronic complications which include CAD, CVA, CKD, retinopathy, and neuropathy. These are all discussed in detail with the patient.  - he  admits there is a room for improvement in his diet and drink choices. -  Suggestion is made for him to avoid simple carbohydrates  from his diet including Cakes, Sweet Desserts / Pastries, Ice Cream, Soda (diet and regular), Sweet Tea, Candies, Chips, Cookies, Sweet Pastries,  Store Bought Juices, Alcohol in Excess of  1-2 drinks a day, Artificial Sweeteners, Coffee Creamer, and "Sugar-free" Products. This will help patient to have stable blood glucose profile and potentially avoid unintended weight gain.    -He has done very well on premixed insulin  Novolin 70/30.   He is advised to continue  Novolin  70/30, 15 units with breakfast and 15 units with supper for pre-meal blood glucose readings above 90 mg/dL, associated with strict monitoring of blood glucose 2 times a day-before breakfast and before supper.  - Patient is warned not to take insulin without proper monitoring per orders.  -He is encouraged to call clinic for blood glucose levels less than 70 or above 300 mg /dl.  -He  is not a candidate for  SGLT2 inhibitors due to  CKD.  He is advised to discontinue naproxen he is taking for pain which may worsen his renal function. - he will be considered for incretin therapy as appropriate next visit.   2) Lipids/HPL: His most recent lipid panel showed controlled LDL at 48 improving from 90.  He is advised to continue pravastatin 20 mg p.o. nightly.  Side effects and precautions discussed with him.    3) hypothyroidism: - His previsit thyroid function tests are consistent with appropriate replacement.  -He is advised to continue levothyroxine 175 mcg p.o. daily before breakfast    - We discussed about the correct intake of his thyroid hormone, on empty stomach at fasting, with water, separated by at least 30 minutes from breakfast and other medications,  and separated by more than 4 hours from calcium, iron, multivitamins, acid reflux medications (PPIs). -Patient is made aware of the fact that thyroid hormone replacement is needed for life, dose to be adjusted by periodic monitoring of thyroid function tests.   4) hypertension-uncontrolled.  He is on low-dose lisinopril 10 mg p.o. daily.  I discussed and added lisinopril/HCTZ 10/12.5 mg p.o. daily in addition to his ongoing lisinopril for better control of blood pressure.  5) weight management: His BMI 39.3.  He is  a candidate for modest weight loss.  Exercise and carbs regimen discussed with him in detail.   - I advised patient to maintain close follow up with Roderic Scarce, MD for primary care needs.  - Time spent on this patient care encounter:  35 min, of which > 50% was spent in  counseling and the rest reviewing his blood glucose logs , discussing his hypoglycemia and hyperglycemia episodes, reviewing his current and  previous labs / studies  ( including abstraction from other facilities) and medications  doses and developing a  long term treatment plan and documenting his care.   Please refer to Patient Instructions for  Blood Glucose Monitoring and  Insulin/Medications Dosing Guide"  in media tab for additional information. Please  also refer to " Patient Self Inventory" in the Media  tab for reviewed elements of pertinent patient history.  Bryce Mejia participated in the discussions, expressed understanding, and voiced agreement with the above plans.  All questions were answered to his satisfaction. he is encouraged to contact clinic should he have any questions or concerns prior to his return visit.   Follow up plan: - Return in about 4 months (around 08/09/2020) for F/U with Pre-visit Labs, Meter, Logs, A1c here.Glade Lloyd, MD Phone: [phone]  Fax: [phone]   04/08/2020, 9:46 AM This note was partially dictated with voice recognition software. Similar sounding words can be transcribed inadequately or may not  be corrected upon review.

## 2020-04-11 ENCOUNTER — Other Ambulatory Visit: Payer: Self-pay | Admitting: "Endocrinology

## 2020-04-16 LAB — LIPID PANEL
Cholesterol: 153 (ref 0–200)
HDL: 45 (ref 35–70)
LDL Cholesterol: 95
Triglycerides: 64 (ref 40–160)

## 2020-04-16 LAB — TSH: TSH: 0.73 (ref 0.41–5.90)

## 2020-04-16 LAB — COMPREHENSIVE METABOLIC PANEL
Calcium: 8.6 — AB (ref 8.7–10.7)
GFR calc Af Amer: 29
GFR calc non Af Amer: 25

## 2020-04-16 LAB — MICROALBUMIN, URINE: Microalb, Ur: 1121.9

## 2020-04-16 LAB — BASIC METABOLIC PANEL
BUN: 38 — AB (ref 4–21)
Creatinine: 2.5 — AB (ref 0.6–1.3)

## 2020-04-30 ENCOUNTER — Other Ambulatory Visit: Payer: Self-pay | Admitting: "Endocrinology

## 2020-08-09 ENCOUNTER — Ambulatory Visit (INDEPENDENT_AMBULATORY_CARE_PROVIDER_SITE_OTHER): Payer: Medicare Other | Admitting: Nurse Practitioner

## 2020-08-09 ENCOUNTER — Encounter: Payer: Self-pay | Admitting: Nurse Practitioner

## 2020-08-09 ENCOUNTER — Other Ambulatory Visit: Payer: Self-pay

## 2020-08-09 VITALS — BP 158/75 | HR 58 | Ht 68.5 in | Wt 260.0 lb

## 2020-08-09 DIAGNOSIS — E039 Hypothyroidism, unspecified: Secondary | ICD-10-CM | POA: Diagnosis not present

## 2020-08-09 DIAGNOSIS — E1122 Type 2 diabetes mellitus with diabetic chronic kidney disease: Secondary | ICD-10-CM

## 2020-08-09 DIAGNOSIS — E1121 Type 2 diabetes mellitus with diabetic nephropathy: Secondary | ICD-10-CM | POA: Diagnosis not present

## 2020-08-09 DIAGNOSIS — I10 Essential (primary) hypertension: Secondary | ICD-10-CM | POA: Diagnosis not present

## 2020-08-09 DIAGNOSIS — N1831 Chronic kidney disease, stage 3a: Secondary | ICD-10-CM

## 2020-08-09 DIAGNOSIS — E782 Mixed hyperlipidemia: Secondary | ICD-10-CM

## 2020-08-09 DIAGNOSIS — Z794 Long term (current) use of insulin: Secondary | ICD-10-CM

## 2020-08-09 LAB — POCT GLYCOSYLATED HEMOGLOBIN (HGB A1C): Hemoglobin A1C: 5.8 % — AB (ref 4.0–5.6)

## 2020-08-09 NOTE — Patient Instructions (Signed)

## 2020-08-09 NOTE — Progress Notes (Signed)
08/09/2020  Endocrinology follow-up note  Subjective:    Patient ID: Bryce Mejia, male    DOB: 06-Oct-1946.  he is being seen in follow-up  in the management of currently uncontrolled symptomatic type 2 diabetes, hypothyroidism, hyperlipidemia, hypertension.   PMD:  Roderic Scarce, MD.   Past Medical History:  Diagnosis Date   Chronic kidney disease, stage 3    CKD stage 3   Diabetes mellitus, type II ([place])    Family history of adverse reaction to anesthesia    Mom- difficult to wake up from anesthesia   H/O atrial flutter    s/p DCCV 08/01/17   Heart attack [facility])    Sleep apnea    Past Surgical History:  Procedure Laterality Date   ABLATION SAPHENOUS VEIN W/ RFA     Saphenous vein endovascular radiofrequency ablation: right smaller saphenous vein 03/13/16 and left small spahenous vein 03/27/16; left GSV RFA 01/08/15, right GSA RFA 01/22/15 ([place])   CARDIAC CATHETERIZATION     04/17/14 ([place]): normal LM, 50% mid LAD, multiple 40-50% distal LAD lesions, occluded mid CX, s/p PROMUS stent, 80% ostial AV branch, long 50% stenosis proximal RCA-mid RCA, 25-30% PDA. Medical tx for LAD and RCA disease Cleora Fleet, MD)   CARPAL TUNNEL RELEASE     NOSE SURGERY     REPLACEMENT TOTAL KNEE     TOTAL KNEE ARTHROPLASTY Right 06/24/2019   TOTAL KNEE ARTHROPLASTY Right 06/24/2019   Procedure: RIGHT TOTAL KNEE ARTHROPLASTY;  Surgeon: Mcarthur Rossetti, MD;  Location: [place];  Service: Orthopedics;  Laterality: Right;   Social History   Socioeconomic History   Marital status: Widowed    Spouse name: Not on file   Number of children: Not on file   Years of education: Not on file   Highest education level: Not on file  Occupational History   Not on file  Tobacco Use   Smoking status: Former Smoker    Quit date: 08/16/2007    Years since quitting: 12.9   Smokeless tobacco: Never Used  Vaping Use   Vaping Use: Never used  Substance and  Sexual Activity   Alcohol use: Yes    Comment: Occassional   Drug use: No   Sexual activity: Not on file  Other Topics Concern   Not on file  Social History Narrative   Not on file   Social Determinants of Health   Financial Resource Strain:    Difficulty of Paying Living Expenses: Not on file  Food Insecurity:    Worried About Charity fundraiser in the Last Year: Not on file   [employer] of Food in the Last Year: Not on file  Transportation Needs:    Lack of Transportation (Medical): Not on file   Lack of Transportation (Non-Medical): Not on file  Physical Activity:    Days of Exercise per Week: Not on file   Minutes of Exercise per Session: Not on file  Stress:    Feeling of Stress : Not on file  Social Connections:    Frequency of Communication with Friends and Family: Not on file   Frequency of Social Gatherings with Friends and Family: Not on file   Attends Religious Services: Not on file   Active Member of Clubs or Organizations: Not on file   Attends Club or Organization Meetings: Not on file   Marital Status: Not on file   Outpatient Encounter Medications as of 08/09/2020  Medication Sig   apixaban (  ELIQUIS) 5 MG TABS tablet Take 5 mg by mouth 2 (two) times daily.   aspirin EC 81 MG tablet Take 81 mg by mouth daily.   cetirizine (ZYRTEC) 10 MG tablet Take 10 mg by mouth daily.   Cholecalciferol (VITAMIN D) 2000 units CAPS Take 2,000 Units by mouth daily.    ciprofloxacin (CIPRO) 500 MG tablet Take 500 mg by mouth 2 (two) times daily.   clonazePAM (KLONOPIN) 0.5 MG tablet Take 0.25-0.5 mg by mouth 2 (two) times daily as needed for anxiety.    docusate sodium (COLACE) 100 MG capsule Take 100 mg by mouth 2 (two) times daily.   EUTHYROX 175 MCG tablet TAKE 1 TABLET BY MOUTH ONCE DAILY BEFORE BREAKFAST   finasteride (PROSCAR) 5 MG tablet Take 5 mg by mouth daily.   fluticasone (FLONASE) 50 MCG/ACT nasal spray Place 2 sprays into both nostrils  daily as needed for allergies or rhinitis.    furosemide (LASIX) 20 MG tablet Take 20 mg by mouth every other day.   HYDROcodone-acetaminophen (NORCO) 5-325 MG tablet Take 1 tablet by mouth every 6 (six) hours as needed for moderate pain. One to two tabs every 4-6 hours for pain   lisinopril (PRINIVIL,ZESTRIL) 10 MG tablet Take 10 mg by mouth daily.   lisinopril-hydrochlorothiazide (ZESTORETIC) 10-12.5 MG tablet Take 1 tablet by mouth daily.   methocarbamol (ROBAXIN) 500 MG tablet Take 1 tablet (500 mg total) by mouth every 6 (six) hours as needed for muscle spasms.   Multiple Vitamins-Minerals (OCUVITE ADULT 50+ PO) Take 1 tablet by mouth daily.    NOVOLIN 70/30 RELION (70-30) 100 UNIT/ML injection INJECT 15 UNITS SUBCUTANEOUSLY TWICE DAILY WITH MEALS   pravastatin (PRAVACHOL) 20 MG tablet Take 20 mg by mouth every other day.    terazosin (HYTRIN) 10 MG capsule Take 10 mg by mouth at bedtime.   traZODone (DESYREL) 100 MG tablet Take 50 mg by mouth at bedtime.    albuterol (VENTOLIN HFA) 108 (90 Base) MCG/ACT inhaler albuterol sulfate HFA 90 mcg/actuation aerosol inhaler  INHALE 1 PUFF BY MOUTH EVERY 4 HOURS AS NEEDED   amLODipine (NORVASC) 5 MG tablet amlodipine 5 mg tablet  TAKE 1 TABLET BY MOUTH ONCE DAILY AT BEDTIME FOR 90 DAYS   carvedilol (COREG) 6.25 MG tablet carvedilol 6.25 mg tablet  TAKE 1 TABLET BY MOUTH TWICE DAILY WITH FOOD   escitalopram (LEXAPRO) 20 MG tablet escitalopram 20 mg tablet  TAKE 1 TABLET BY MOUTH ONCE DAILY   LORazepam (ATIVAN) 2 MG tablet lorazepam 2 mg tablet  Take 1 tablet 3 times a day by oral route.   No facility-administered encounter medications on file as of 08/09/2020.    ALLERGIES: No Known Allergies  VACCINATION STATUS:  There is no immunization history on file for this patient.  Diabetes He presents for his follow-up diabetic visit. He has type 2 diabetes mellitus. Onset time: He was diagnosed at approximate age of 43 years. His  disease course has been improving. There are no hypoglycemic associated symptoms. Pertinent negatives for hypoglycemia include no confusion, headaches, pallor or seizures. Pertinent negatives for diabetes include no chest pain, no fatigue, no polydipsia, no polyphagia, no polyuria and no weakness. There are no hypoglycemic complications. Symptoms are improving. Diabetic complications include heart disease and nephropathy. Risk factors for coronary artery disease include dyslipidemia, diabetes mellitus, hypertension, male sex, obesity, sedentary lifestyle and tobacco exposure. Current diabetic treatment includes insulin injections. He is compliant with treatment most of the time. His weight is stable.  He is following a generally healthy and diabetic diet. When asked about meal planning, he reported none. He has not had a previous visit with a dietitian. He never participates in exercise. His home blood glucose trend is fluctuating minimally. His breakfast blood glucose range is generally 110-130 mg/dl. His dinner blood glucose range is generally 90-110 mg/dl. (He presents today with his meter and logs showing slightly above target fasting glycemic profile.  He has been frequently missing his second dose of insulin, not checking his glucose at that time (he says if he feels like his sugar is low, he wont check it, he will just go ahead and treat it).  His POCT A1C today is 5.8%, improving from last visit of 6.2%.  ) An ACE inhibitor/angiotensin II receptor blocker is being taken. He does not see a podiatrist.Eye exam is current.  Hyperlipidemia This is a chronic problem. The current episode started more than 1 year ago. The problem is controlled. Recent lipid tests were reviewed and are normal. Exacerbating diseases include chronic renal disease, diabetes, hypothyroidism and obesity. Factors aggravating his hyperlipidemia include fatty foods. Pertinent negatives include no chest pain, myalgias or shortness of  breath. Current antihyperlipidemic treatment includes statins. The current treatment provides mild improvement of lipids. Compliance problems include adherence to exercise and adherence to diet.  Risk factors for coronary artery disease include diabetes mellitus, dyslipidemia, hypertension, male sex, obesity and a sedentary lifestyle.  Hypertension This is a chronic problem. The current episode started more than 1 year ago. The problem has been gradually improving since onset. The problem is uncontrolled. Pertinent negatives include no chest pain, headaches, neck pain, palpitations or shortness of breath. Agents associated with hypertension include thyroid hormones. Risk factors for coronary artery disease include dyslipidemia, diabetes mellitus, male gender, obesity, sedentary lifestyle and smoking/tobacco exposure. Past treatments include ACE inhibitors, diuretics and alpha 1 blockers. The current treatment provides mild improvement. Compliance problems include exercise and diet.  Hypertensive end-organ damage includes kidney disease, CAD/MI and heart failure. Identifiable causes of hypertension include chronic renal disease.    Review of systems  Constitutional: + Minimally fluctuating body weight,  current Body mass index is 38.96 kg/m. , no fatigue, no subjective hyperthermia, no subjective hypothermia Eyes: no blurry vision, no xerophthalmia ENT: no sore throat, no nodules palpated in throat, no dysphagia/odynophagia, no hoarseness Cardiovascular: no chest pain, no shortness of breath, no palpitations, no leg swelling Respiratory: no cough, no shortness of breath Gastrointestinal: no nausea/vomiting/diarrhea Musculoskeletal: no muscle/joint aches Skin: no rashes, no hyperemia Neurological: no tremors, no numbness, no tingling, no dizziness Psychiatric: no depression, no anxiety    Objective:    BP (!) 158/75 (BP Location: Left Arm, Patient Position: Sitting)    Pulse (!) 58    Ht 5' 8.5"  (1.74 m)    Wt 260 lb (117.9 kg)    BMI 38.96 kg/m   Wt Readings from Last 3 Encounters:  08/09/20 260 lb (117.9 kg)  04/08/20 262 lb 6.4 oz (119 kg)  06/26/19 253 lb 8.5 oz (115 kg)    BP Readings from Last 3 Encounters:  08/09/20 (!) 158/75  04/08/20 (!) 162/74  06/28/19 (!) 140/57     Physical Exam- Limited  Constitutional:  Body mass index is 38.96 kg/m. , not in acute distress, normal state of mind Eyes:  EOMI, no exophthalmos Neck: Supple Thyroid: No gross goiter Cardiovascular: RRR, no murmers, rubs, or gallops, no edema Respiratory: Adequate breathing efforts, no crackles, rales, rhonchi, or wheezing  Musculoskeletal: no gross deformities, strength intact in all four extremities, no gross restriction of joint movements Skin:  no rashes, no hyperemia Neurological: no tremor with outstretched hands  CMP     Component Value Date/Time   NA 140 12/01/2019 1514   K 4.8 12/01/2019 1514   CL 109 12/01/2019 1514   CO2 25 12/01/2019 1514   GLUCOSE 124 12/01/2019 1514   BUN 38 (A) 04/16/2020 0000   CREATININE 2.5 (A) 04/16/2020 0000   CREATININE 2.07 (H) 12/01/2019 1514   CALCIUM 8.6 (A) 04/16/2020 0000   PROT 6.0 (L) 12/01/2019 1514   AST 18 12/01/2019 1514   ALT 12 12/01/2019 1514   BILITOT 0.3 12/01/2019 1514   GFRNONAA 25 04/16/2020 0000   GFRNONAA 31 (L) 12/01/2019 1514   GFRAA 29 04/16/2020 0000   GFRAA 36 (L) 12/01/2019 1514    Lipid Panel     Component Value Date/Time   CHOL 153 04/16/2020 0000   TRIG 64 04/16/2020 0000   HDL 45 04/16/2020 0000   CHOLHDL 2.8 05/13/2018 1554   LDLCALC 95 04/16/2020 0000   LDLCALC 48 05/13/2018 1554   Results for MYKAI, WENDORF (MRN [id number]) as of 08/09/2020 09:46  Ref. Range 06/20/2019 12:11 06/25/2019 03:44 12/01/2019 15:14 04/08/2020 10:12 04/16/2020 00:00  eAG (mmol/L) Latest Units: (calc)   7.4    Glucose Latest Ref Range: 65 - 139 mg/dL 144 (H) 173 (H) 124    Hemoglobin A1C Latest Ref Range: 4.0 - 5.6 %   6.3 (H) 6.2  (A)   TSH Latest Ref Range: 0.41 - 5.90    1.11  0.73  T4,Free(Direct) Latest Ref Range: 0.8 - 1.8 ng/dL   1.1       Assessment & Plan:   1. DM type 2 causing vascular disease ([place]) - Patient has currently uncontrolled symptomatic type 2 DM since  74 years of age.  He presents today with his meter and logs showing slightly above target fasting glycemic profile.  He has been frequently missing his second dose of insulin, not checking his glucose at that time (he says if he feels like his sugar is low, he wont check it, he will just go ahead and treat it).  His POCT A1C today is 5.8%, improving from last visit of 6.2%.    -his diabetes is complicated by coronary artery disease, CKD, obesity/sedentary life and Chael Urenda remains at a high risk for more acute and chronic complications which include CAD, CVA, CKD, retinopathy, and neuropathy. These are all discussed in detail with the patient.  - The patient admits there is a room for improvement in their diet and drink choices. -  Suggestion is made for the patient to avoid simple carbohydrates from their diet including Cakes, Sweet Desserts / Pastries, Ice Cream, Soda (diet and regular), Sweet Tea, Candies, Chips, Cookies, Sweet Pastries,  Store Bought Juices, Alcohol in Excess of  1-2 drinks a day, Artificial Sweeteners, Coffee Creamer, and "Sugar-free" Products. This will help patient to have stable blood glucose profile and potentially avoid unintended weight gain.   - I encouraged the patient to switch to  unprocessed or minimally processed complex starch and increased protein intake (animal or plant source), fruits, and vegetables.   - Patient is advised to stick to a routine mealtimes to eat 3 meals  a day and avoid unnecessary snacks ( to snack only to correct hypoglycemia).  -He has done very well on premixed insulin  Novolin 70/30.   -Given his tightening  glycemic profile, he is advised to reduce his Novolin 70/30 to 10 units with  breakfast and 10 units with supper.  -He is encouraged to monitor blood glucose at least 3 times per day, before injecting insulin at breakfast and supper, and before bed and call the clinic if readings are less than 70 or greater than 200 for 3 tests in a row.  -He  is not a candidate for  SGLT2 inhibitors due to CKD.  He is advised to discontinue naproxen he is taking for pain which may worsen his renal function.  - he will be considered for incretin therapy as appropriate next visit.  2) Lipids/HPL:  His most recent lipid panel from 04/15/20 shows worsening but controlled LDL at 95.  He is advised to continue Pravastatin 20 mg po daily at bedtime.  Side effects and precautions discussed with patient.  He has also since adopted a healthier diet, limiting fried foods and butter.  3) Hypothyroidism: -His most recent TFTs on 04/15/20 are consistent with appropriate replacement.  He is advised to continue Levothyroxine 175 mcg po daily before breakfast.  Will recheck TFTs prior to next visit and adjust dose if needed.   - We discussed about the correct intake of his thyroid hormone, on empty stomach at fasting, with water, separated by at least 30 minutes from breakfast and other medications,  and separated by more than 4 hours from calcium, iron, multivitamins, acid reflux medications (PPIs). -Patient is made aware of the fact that thyroid hormone replacement is needed for life, dose to be adjusted by periodic monitoring of thyroid function tests.  4) Hypertension His blood pressure is not controlled to target, yet is improved from last visit.  He is advised to continue Lasix 20 mg po every other day, Lisinopril HCT 20-12.5 mg po daily, and Terazosin 10 mg po daily at bedtime. Will consider changing dose on subsequent visits if BP remains above 150/80.  5) Weight management:  His Body mass index is 38.96 kg/m.-- clearly complicating his diabetes care. He is a candidate for modest weight loss.   Exercise and carbs regimen discussed with him in detail.   - I advised patient to maintain close follow up with Roderic Scarce, MD for primary care needs.  - Time spent on this patient care encounter:  35 min, of which > 50% was spent in  counseling and the rest reviewing his blood glucose logs , discussing his hypoglycemia and hyperglycemia episodes, reviewing his current and  previous labs / studies  ( including abstraction from other facilities) and medications  doses and developing a  long term treatment plan and documenting his care.   Please refer to Patient Instructions for Blood Glucose Monitoring and Insulin/Medications Dosing Guide"  in media tab for additional information. Please  also refer to " Patient Self Inventory" in the Media  tab for reviewed elements of pertinent patient history.  Orlen Leedy participated in the discussions, expressed understanding, and voiced agreement with the above plans.  All questions were answered to his satisfaction. he is encouraged to contact clinic should he have any questions or concerns prior to his return visit.   Follow up plan: - Return in about 4 months (around 12/09/2020) for Diabetes follow up, Thyroid follow up, Previsit labs, Virtual visit ok.  Rayetta Pigg, [facility] [address] [place], [place] [address] Phone: [phone] Fax: [phone]  08/09/2020, 9:48 AM This note was partially dictated with voice recognition software. Similar sounding words  can be transcribed inadequately or may not  be corrected upon review.

## 2020-08-10 ENCOUNTER — Other Ambulatory Visit: Payer: Self-pay

## 2020-10-05 ENCOUNTER — Other Ambulatory Visit: Payer: Self-pay | Admitting: "Endocrinology

## 2020-10-28 ENCOUNTER — Other Ambulatory Visit: Payer: Self-pay | Admitting: "Endocrinology

## 2020-11-01 IMAGING — DX DG KNEE COMPLETE 4+V*R*
4 series · 4 of 4 positions shown · non-contrast
Comparison: None.

CLINICAL DATA: Fell and injured right knee.

EXAM:
RIGHT KNEE - COMPLETE 4+ VIEW

[knee ap (1 of 3)]
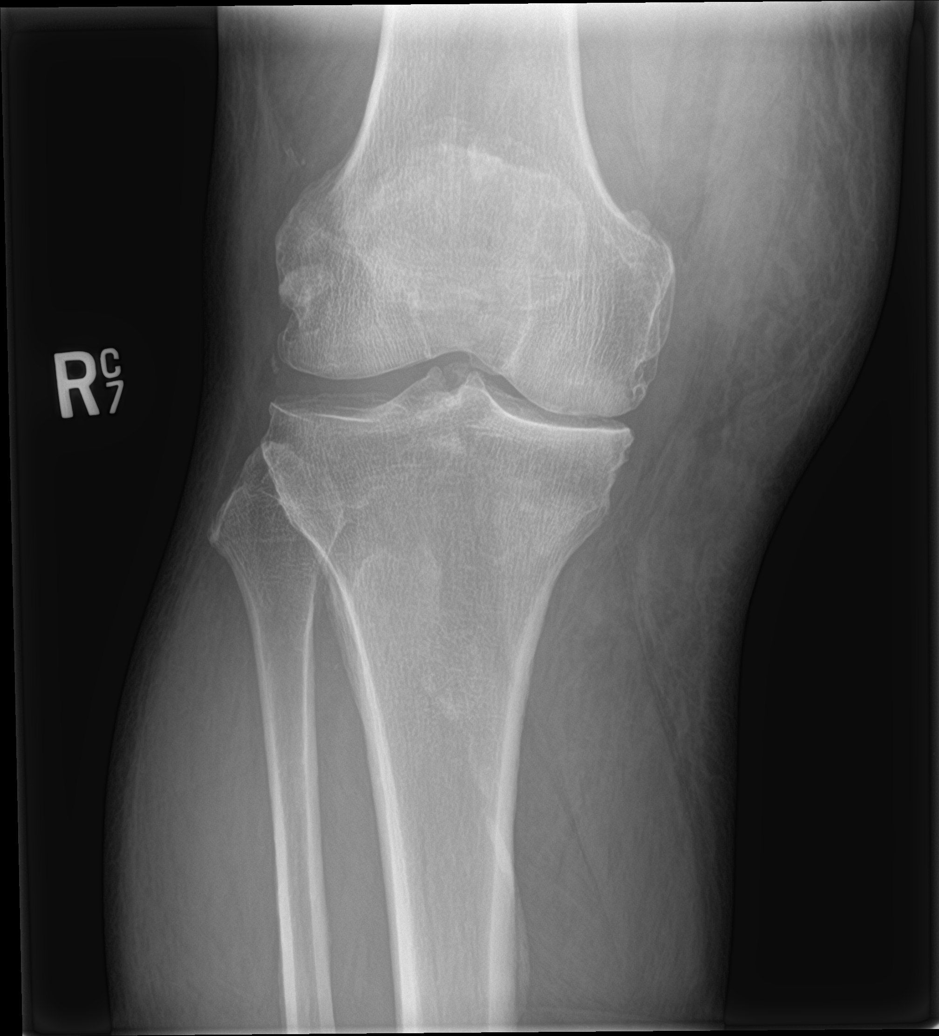

[knee ap (2 of 3)]
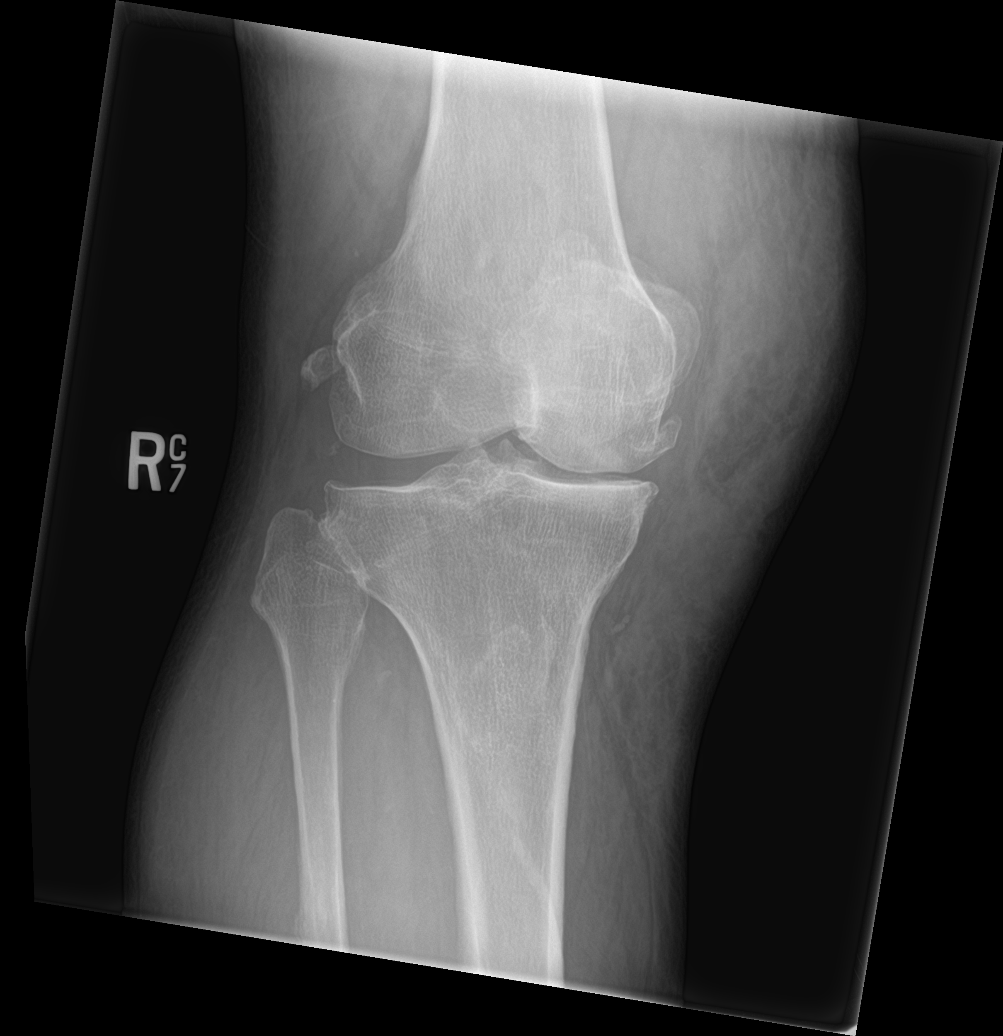

[knee ap (3 of 3)]
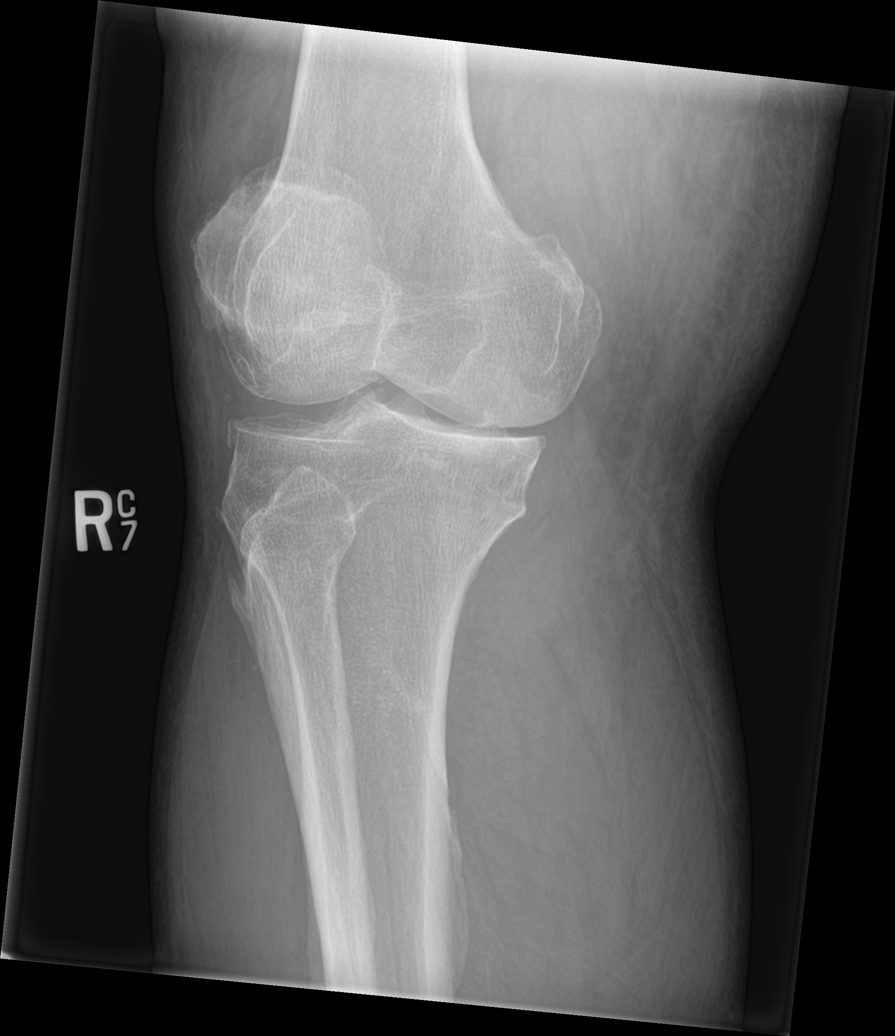

[knee lat]
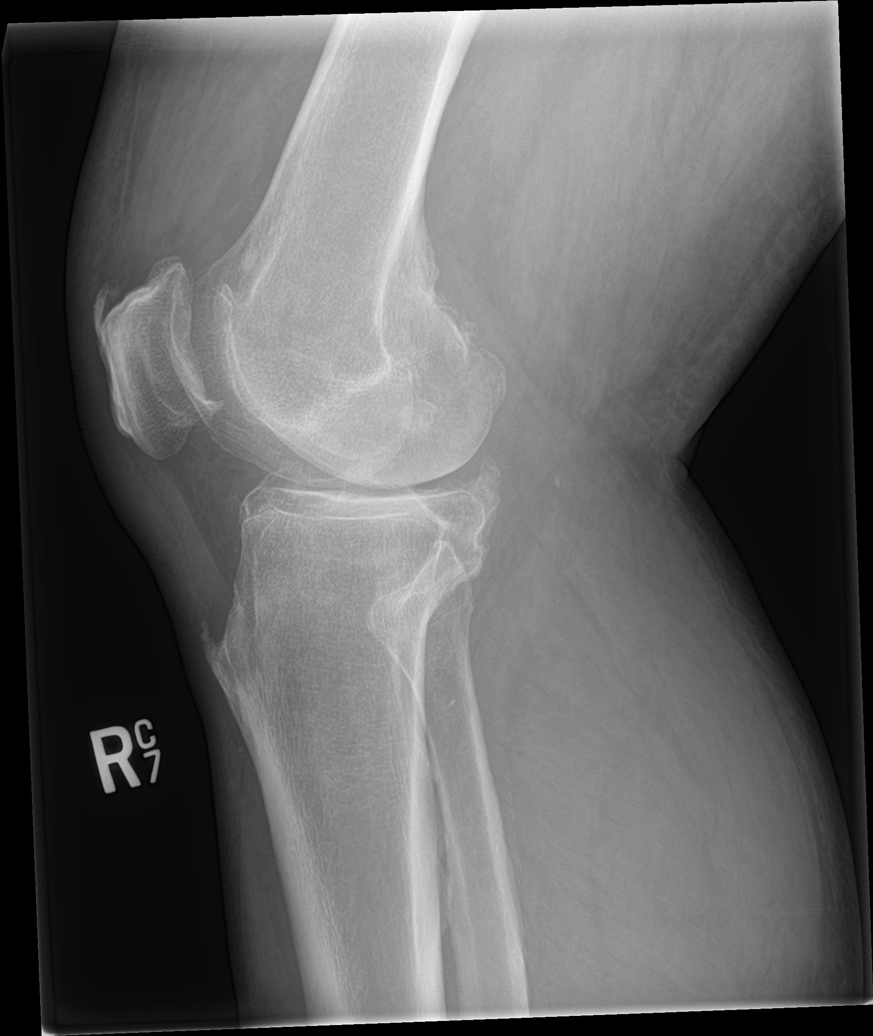

[4 of 4 positions shown; findings below may reference images not displayed]

FINDINGS: Moderate tricompartmental degenerative changes with joint space
narrowing and spurring. No acute fracture or osteochondral lesion.
Possible small joint effusion.
IMPRESSION: Tricompartmental degenerative changes but no acute fracture.

## 2020-11-23 ENCOUNTER — Other Ambulatory Visit: Payer: Self-pay | Admitting: "Endocrinology

## 2020-11-25 ENCOUNTER — Other Ambulatory Visit: Payer: Self-pay | Admitting: Nurse Practitioner

## 2020-11-25 MED ORDER — NOVOLOG MIX 70/30 FLEXPEN (70-30) 100 UNIT/ML ~~LOC~~ SUPN: 10.0000 [IU] | PEN_INJECTOR | Freq: Two times a day (BID) | SUBCUTANEOUS | 3 refills | Status: DC

## 2020-12-02 ENCOUNTER — Other Ambulatory Visit: Payer: Self-pay | Admitting: Nurse Practitioner

## 2020-12-02 MED ORDER — INSULIN ASPART PROT & ASPART (70-30 MIX) 100 UNIT/ML PEN: 10.0000 [IU] | PEN_INJECTOR | Freq: Two times a day (BID) | SUBCUTANEOUS | 3 refills | Status: DC

## 2020-12-06 ENCOUNTER — Other Ambulatory Visit: Payer: Self-pay

## 2020-12-06 DIAGNOSIS — N1831 Chronic kidney disease, stage 3a: Secondary | ICD-10-CM

## 2020-12-06 DIAGNOSIS — E039 Hypothyroidism, unspecified: Secondary | ICD-10-CM

## 2020-12-06 DIAGNOSIS — E782 Mixed hyperlipidemia: Secondary | ICD-10-CM

## 2020-12-06 DIAGNOSIS — Z794 Long term (current) use of insulin: Secondary | ICD-10-CM

## 2020-12-06 DIAGNOSIS — I10 Essential (primary) hypertension: Secondary | ICD-10-CM

## 2020-12-07 ENCOUNTER — Other Ambulatory Visit: Payer: Self-pay | Admitting: Nurse Practitioner

## 2020-12-07 ENCOUNTER — Other Ambulatory Visit: Payer: Self-pay

## 2020-12-07 DIAGNOSIS — N1831 Chronic kidney disease, stage 3a: Secondary | ICD-10-CM

## 2020-12-07 DIAGNOSIS — E782 Mixed hyperlipidemia: Secondary | ICD-10-CM

## 2020-12-07 DIAGNOSIS — Z794 Long term (current) use of insulin: Secondary | ICD-10-CM

## 2020-12-07 LAB — COMPREHENSIVE METABOLIC PANEL
ALT: 19 IU/L (ref 0–44)
AST: 19 IU/L (ref 0–40)
Albumin/Globulin Ratio: 1.7 (ref 1.2–2.2)
Albumin: 3.7 g/dL (ref 3.7–4.7)
Alkaline Phosphatase: 79 IU/L (ref 44–121)
BUN/Creatinine Ratio: 17 (ref 10–24)
BUN: 53 mg/dL — ABNORMAL HIGH (ref 8–27)
Bilirubin Total: <0.2 mg/dL (ref 0.0–1.2)
CO2: 17 mmol/L — ABNORMAL LOW (ref 20–29)
Calcium: 8.5 mg/dL — ABNORMAL LOW (ref 8.6–10.2)
Chloride: 113 mmol/L — ABNORMAL HIGH (ref 96–106)
Creatinine, Ser: 3.17 mg/dL — ABNORMAL HIGH (ref 0.76–1.27)
GFR calc Af Amer: 21 mL/min/{1.73_m2} — ABNORMAL LOW (ref >59)
GFR calc non Af Amer: 18 mL/min/{1.73_m2} — ABNORMAL LOW (ref >59)
Globulin, Total: 2.2 g/dL (ref 1.5–4.5)
Glucose: 129 mg/dL — ABNORMAL HIGH (ref 65–99)
Potassium: 5.5 mmol/L — ABNORMAL HIGH (ref 3.5–5.2)
Sodium: 144 mmol/L (ref 134–144)
Total Protein: 5.9 g/dL — ABNORMAL LOW (ref 6.0–8.5)

## 2020-12-07 LAB — T4, FREE: Free T4: 0.97 ng/dL (ref 0.82–1.77)

## 2020-12-07 LAB — LIPID PANEL
Chol/HDL Ratio: 3.6 ratio (ref 0.0–5.0)
Cholesterol, Total: 142 mg/dL (ref 100–199)
HDL: 39 mg/dL — ABNORMAL LOW (ref >39)
LDL Chol Calc (NIH): 85 mg/dL (ref 0–99)
Triglycerides: 97 mg/dL (ref 0–149)
VLDL Cholesterol Cal: 18 mg/dL (ref 5–40)

## 2020-12-07 LAB — TSH: TSH: 20.7 u[IU]/mL — ABNORMAL HIGH (ref 0.450–4.500)

## 2020-12-07 MED ORDER — NOVOLIN 70/30 (70-30) 100 UNIT/ML ~~LOC~~ SUSP: SUBCUTANEOUS | 2 refills | Status: DC

## 2020-12-13 ENCOUNTER — Other Ambulatory Visit: Payer: Self-pay

## 2020-12-13 ENCOUNTER — Encounter: Payer: Self-pay | Admitting: Nurse Practitioner

## 2020-12-13 ENCOUNTER — Ambulatory Visit (INDEPENDENT_AMBULATORY_CARE_PROVIDER_SITE_OTHER): Payer: Medicare Other | Admitting: Nurse Practitioner

## 2020-12-13 VITALS — BP 177/83 | HR 60 | Ht 68.5 in | Wt 269.6 lb

## 2020-12-13 DIAGNOSIS — Z794 Long term (current) use of insulin: Secondary | ICD-10-CM | POA: Diagnosis not present

## 2020-12-13 DIAGNOSIS — E1122 Type 2 diabetes mellitus with diabetic chronic kidney disease: Secondary | ICD-10-CM

## 2020-12-13 DIAGNOSIS — I10 Essential (primary) hypertension: Secondary | ICD-10-CM | POA: Diagnosis not present

## 2020-12-13 DIAGNOSIS — E782 Mixed hyperlipidemia: Secondary | ICD-10-CM | POA: Diagnosis not present

## 2020-12-13 DIAGNOSIS — E039 Hypothyroidism, unspecified: Secondary | ICD-10-CM | POA: Diagnosis not present

## 2020-12-13 DIAGNOSIS — N184 Chronic kidney disease, stage 4 (severe): Secondary | ICD-10-CM

## 2020-12-13 LAB — POCT GLYCOSYLATED HEMOGLOBIN (HGB A1C): HbA1c, POC (controlled diabetic range): 6.1 % (ref 0.0–7.0)

## 2020-12-13 MED ORDER — LEVOTHYROXINE SODIUM 200 MCG PO TABS: 200.0000 ug | ORAL_TABLET | Freq: Every day | ORAL | 3 refills | Status: DC

## 2020-12-13 NOTE — Patient Instructions (Signed)

## 2020-12-13 NOTE — Progress Notes (Signed)
12/13/2020  Endocrinology follow-up note  Subjective:    Patient ID: Bryce Mejia, male    DOB: [birth year].  he is being seen in follow-up  in the management of currently uncontrolled symptomatic type 2 diabetes, hypothyroidism, hyperlipidemia, hypertension.   PMD:  Roderic Scarce, MD.   Past Medical History:  Diagnosis Date  . Chronic kidney disease, stage 3 (HCC)    CKD stage 3  . Diabetes mellitus, type II ([place])   . Family history of adverse reaction to anesthesia    Mom- difficult to wake up from anesthesia  . H/O atrial flutter    s/p DCCV 08/01/17  . Heart attack ([place])   . Sleep apnea    Past Surgical History:  Procedure Laterality Date  . ABLATION SAPHENOUS VEIN W/ RFA     Saphenous vein endovascular radiofrequency ablation: right smaller saphenous vein 03/13/16 and left small spahenous vein 03/27/16; left GSV RFA 01/08/15, right GSA RFA 01/22/15 ([place])  . CARDIAC CATHETERIZATION     04/17/14 ([place]): normal LM, 50% mid LAD, multiple 40-50% distal LAD lesions, occluded mid CX, s/p PROMUS stent, 80% ostial AV branch, long 50% stenosis proximal RCA-mid RCA, 25-30% PDA. Medical tx for LAD and RCA disease Cleora Fleet, MD)  . CARPAL TUNNEL RELEASE    . NOSE SURGERY    . REPLACEMENT TOTAL KNEE    . TOTAL KNEE ARTHROPLASTY Right 06/24/2019  . TOTAL KNEE ARTHROPLASTY Right 06/24/2019   Procedure: RIGHT TOTAL KNEE ARTHROPLASTY;  Surgeon: Mcarthur Rossetti, MD;  Location: [facility];  Service: Orthopedics;  Laterality: Right;   Social History   Socioeconomic History  . Marital status: Widowed    Spouse name: Not on file  . Number of children: Not on file  . Years of education: Not on file  . Highest education level: Not on file  Occupational History  . Not on file  Tobacco Use  . Smoking status: Former Smoker    Quit date: 08/16/2007    Years since quitting: 13.3  . Smokeless tobacco: Never Used  Vaping Use  . Vaping Use: Never used   Substance and Sexual Activity  . Alcohol use: Yes    Comment: Occassional  . Drug use: No  . Sexual activity: Not on file  Other Topics Concern  . Not on file  Social History Narrative  . Not on file   Social Determinants of Health   Financial Resource Strain: Not on file  Food Insecurity: Not on file  Transportation Needs: Not on file  Physical Activity: Not on file  Stress: Not on file  Social Connections: Not on file   Outpatient Encounter Medications as of 12/13/2020  Medication Sig  . acetaminophen (TYLENOL) 500 MG tablet Take 2 tablets by mouth at bedtime.  Marland Kitchen albuterol (VENTOLIN HFA) 108 (90 Base) MCG/ACT inhaler albuterol sulfate HFA 90 mcg/actuation aerosol inhaler  INHALE 1 PUFF BY MOUTH EVERY 4 HOURS AS NEEDED  . apixaban (ELIQUIS) 5 MG TABS tablet Take 5 mg by mouth 2 (two) times daily.  . Ascorbic Acid (VITAMIN C CR) 1000 MG TBCR Take 1 tablet by mouth daily.  Marland Kitchen aspirin EC 81 MG tablet Take 81 mg by mouth daily.  Marland Kitchen b complex vitamins capsule Take 1 capsule by mouth daily.  . carvedilol (COREG) 6.25 MG tablet carvedilol 6.25 mg tablet  TAKE 1 TABLET BY MOUTH TWICE DAILY WITH FOOD  . cetirizine (ZYRTEC) 10 MG tablet Take 10 mg by mouth daily.  Marland Kitchen  Cholecalciferol (VITAMIN D) 2000 units CAPS Take 2,000 Units by mouth daily.   . clonazePAM (KLONOPIN) 0.5 MG tablet Take 0.25-0.5 mg by mouth 2 (two) times daily as needed for anxiety.   . diphenhydrAMINE (BENADRYL) 25 MG tablet Take 25 mg by mouth at bedtime.  . docusate sodium (COLACE) 100 MG capsule Take 100 mg by mouth 2 (two) times daily.  Marland Kitchen escitalopram (LEXAPRO) 20 MG tablet escitalopram 20 mg tablet  TAKE 1 TABLET BY MOUTH ONCE DAILY  . finasteride (PROSCAR) 5 MG tablet Take 5 mg by mouth daily.  . Flaxseed, Linseed, (FLAXSEED OIL) 1000 MG CAPS Take 1 capsule by mouth daily.  . fluticasone (FLONASE) 50 MCG/ACT nasal spray Place 2 sprays into both nostrils daily as needed for allergies or rhinitis.   . furosemide  (LASIX) 20 MG tablet Take 20 mg by mouth every other day.  Marland Kitchen HYDROcodone-acetaminophen (NORCO) 5-325 MG tablet Take 1 tablet by mouth every 6 (six) hours as needed for moderate pain. One to two tabs every 4-6 hours for pain  . insulin NPH-regular Human (NOVOLIN 70/30) (70-30) 100 UNIT/ML injection Novolin 70/30 U-100 Insulin 100 unit/mL subcutaneous suspension  INJECT 10 UNITS SUBCUTANEOUSLY TWICE DAILY WITH A MEAL  . LOKELMA 10 g PACK packet SMARTSIG:1 Packet(s) By Mouth Twice a Week  . losartan (COZAAR) 100 MG tablet Take 100 mg by mouth daily.  . Multiple Vitamins-Minerals (OCUVITE ADULT 50+ PO) Take 1 tablet by mouth daily.   . Placebo (CHERRY CONCENTRATE) CONC Take 500 mg by mouth daily.  . pravastatin (PRAVACHOL) 20 MG tablet Take 20 mg by mouth every other day.   . terazosin (HYTRIN) 10 MG capsule Take 10 mg by mouth at bedtime.  . traZODone (DESYREL) 100 MG tablet Take by mouth.  . triamcinolone (KENALOG) 0.1 % triamcinolone acetonide 0.1 % topical cream  APPLY TOPICALLY TWICE DAILY  . zinc gluconate 50 MG tablet Take 50 mg by mouth daily.  . [DISCONTINUED] EUTHYROX 175 MCG tablet TAKE 1 TABLET BY MOUTH ONCE DAILY BEFORE BREAKFAST  . levothyroxine (EUTHYROX) 200 MCG tablet Take 1 tablet (200 mcg total) by mouth daily before breakfast.   No facility-administered encounter medications on file as of 12/13/2020.    ALLERGIES: No Known Allergies  VACCINATION STATUS:  There is no immunization history on file for this patient.  Diabetes He presents for his follow-up diabetic visit. He has type 2 diabetes mellitus. Onset time: He was diagnosed at approximate age of 20 years. His disease course has been stable. There are no hypoglycemic associated symptoms. Pertinent negatives for hypoglycemia include no confusion, headaches, pallor or seizures. Pertinent negatives for diabetes include no chest pain, no fatigue, no polydipsia, no polyphagia, no polyuria and no weakness. There are no  hypoglycemic complications. Symptoms are stable. Diabetic complications include heart disease and nephropathy. Risk factors for coronary artery disease include dyslipidemia, diabetes mellitus, hypertension, male sex, obesity, sedentary lifestyle and tobacco exposure. Current diabetic treatment includes insulin injections. He is compliant with treatment most of the time. His weight is stable. He is following a generally healthy and diabetic diet. When asked about meal planning, he reported none. He has not had a previous visit with a dietitian. He never participates in exercise. His home blood glucose trend is fluctuating minimally. His breakfast blood glucose range is generally 130-140 mg/dl. His dinner blood glucose range is generally 130-140 mg/dl. His overall blood glucose range is 130-140 mg/dl. (He presents today with his meter and logs showing stable glycemic profile.  His POCT A1c today is 6.1% increasing from last visit of 5.8%.  He denies any hypoglycemia.  Has not been able to exercise as usual due to winter weather and cold temperatures.) An ACE inhibitor/angiotensin II receptor blocker is being taken. He does not see a podiatrist.Eye exam is current.  Hyperlipidemia This is a chronic problem. The current episode started more than 1 year ago. The problem is controlled. Recent lipid tests were reviewed and are normal. Exacerbating diseases include chronic renal disease, diabetes, hypothyroidism and obesity. Factors aggravating his hyperlipidemia include fatty foods. Pertinent negatives include no chest pain, myalgias or shortness of breath. Current antihyperlipidemic treatment includes statins. The current treatment provides mild improvement of lipids. Compliance problems include adherence to exercise and adherence to diet.  Risk factors for coronary artery disease include diabetes mellitus, dyslipidemia, hypertension, male sex, obesity and a sedentary lifestyle.  Hypertension This is a chronic  problem. The current episode started more than 1 year ago. The problem has been gradually improving since onset. The problem is uncontrolled. Pertinent negatives include no chest pain, headaches, neck pain, palpitations or shortness of breath. Agents associated with hypertension include thyroid hormones. Risk factors for coronary artery disease include dyslipidemia, diabetes mellitus, male gender, obesity, sedentary lifestyle and smoking/tobacco exposure. Past treatments include ACE inhibitors, diuretics and alpha 1 blockers. The current treatment provides mild improvement. Compliance problems include exercise and diet.  Hypertensive end-organ damage includes kidney disease, CAD/MI and heart failure. Identifiable causes of hypertension include chronic renal disease.    Review of systems  Constitutional: + Minimally fluctuating body weight,  current Body mass index is 40.4 kg/m. , no fatigue, no subjective hyperthermia, no subjective hypothermia Eyes: no blurry vision, no xerophthalmia ENT: no sore throat, no nodules palpated in throat, no dysphagia/odynophagia, no hoarseness Cardiovascular: no chest pain, no shortness of breath, no palpitations, no leg swelling Respiratory: no cough, no shortness of breath Gastrointestinal: no nausea/vomiting/diarrhea Musculoskeletal: no muscle/joint aches Skin: no rashes, no hyperemia Neurological: no tremors, no numbness, no tingling, no dizziness Psychiatric: no depression, no anxiety    Objective:    BP (!) 177/83 (BP Location: Left Arm, Patient Position: Sitting)   Pulse 60   Ht 5' 8.5" (1.74 m)   Wt 269 lb 9.6 oz (122.3 kg)   BMI 40.40 kg/m   Wt Readings from Last 3 Encounters:  12/13/20 269 lb 9.6 oz (122.3 kg)  08/09/20 260 lb (117.9 kg)  04/08/20 262 lb 6.4 oz (119 kg)    BP Readings from Last 3 Encounters:  12/13/20 (!) 177/83  08/09/20 (!) 158/75  04/08/20 (!) 162/74    Physical Exam- Limited  Constitutional:  Body mass index is  40.4 kg/m. , not in acute distress, normal state of mind Eyes:  EOMI, no exophthalmos Neck: Supple Cardiovascular: RRR, no murmers, rubs, or gallops, no edema Respiratory: Adequate breathing efforts, no crackles, rales, rhonchi, or wheezing Musculoskeletal: no gross deformities, strength intact in all four extremities, no gross restriction of joint movements Skin:  no rashes, no hyperemia Neurological: no tremor with outstretched hands    CMP     Component Value Date/Time   NA 144 12/06/2020 1112   K 5.5 (H) 12/06/2020 1112   CL 113 (H) 12/06/2020 1112   CO2 17 (L) 12/06/2020 1112   GLUCOSE 129 (H) 12/06/2020 1112   GLUCOSE 124 12/01/2019 1514   BUN 53 (H) 12/06/2020 1112   CREATININE 3.17 (H) 12/06/2020 1112   CREATININE 2.07 (H) 12/01/2019 1514   CALCIUM  8.5 (L) 12/06/2020 1112   PROT 5.9 (L) 12/06/2020 1112   ALBUMIN 3.7 12/06/2020 1112   AST 19 12/06/2020 1112   ALT 19 12/06/2020 1112   ALKPHOS 79 12/06/2020 1112   BILITOT <0.2 12/06/2020 1112   GFRNONAA 18 (L) 12/06/2020 1112   GFRNONAA 31 (L) 12/01/2019 1514   GFRAA 21 (L) 12/06/2020 1112   GFRAA 36 (L) 12/01/2019 1514    Lipid Panel     Component Value Date/Time   CHOL 142 12/06/2020 1112   TRIG 97 12/06/2020 1112   HDL 39 (L) 12/06/2020 1112   CHOLHDL 3.6 12/06/2020 1112   CHOLHDL 2.8 05/13/2018 1554   LDLCALC 85 12/06/2020 1112   LDLCALC 48 05/13/2018 1554   LABVLDL 18 12/06/2020 1112   Results for JURIEL, STAHR (MRN [id number]) as of 08/09/2020 09:46  Ref. Range 06/20/2019 12:11 06/25/2019 03:44 12/01/2019 15:14 04/08/2020 10:12 04/16/2020 00:00  eAG (mmol/L) Latest Units: (calc)   7.4    Glucose Latest Ref Range: 65 - 139 mg/dL 144 (H) 173 (H) 124    Hemoglobin A1C Latest Ref Range: 4.0 - 5.6 %   6.3 (H) 6.2 (A)   TSH Latest Ref Range: 0.41 - 5.90    1.11  0.73  T4,Free(Direct) Latest Ref Range: 0.8 - 1.8 ng/dL   1.1       Assessment & Plan:   1) DM type 2 causing vascular disease (HCC)  - Patient  has currently uncontrolled symptomatic type 2 DM since  75 years of age.  He presents today with his meter and logs showing stable glycemic profile.  His POCT A1c today is 6.1% increasing from last visit of 5.8%.  He denies any hypoglycemia.  Has not been able to exercise as usual due to winter weather and cold temperatures.  -his diabetes is complicated by coronary artery disease, CKD, obesity/sedentary life and [place]ino remains at a high risk for more acute and chronic complications which include CAD, CVA, CKD, retinopathy, and neuropathy. These are all discussed in detail with the patient.  - Nutritional counseling repeated at each appointment due to patients tendency to fall back in to old habits.  - The patient admits there is a room for improvement in their diet and drink choices. -  Suggestion is made for the patient to avoid simple carbohydrates from their diet including Cakes, Sweet Desserts / Pastries, Ice Cream, Soda (diet and regular), Sweet Tea, Candies, Chips, Cookies, Sweet Pastries,  Store Bought Juices, Alcohol in Excess of  1-2 drinks a day, Artificial Sweeteners, Coffee Creamer, and "Sugar-free" Products. This will help patient to have stable blood glucose profile and potentially avoid unintended weight gain.   - I encouraged the patient to switch to  unprocessed or minimally processed complex starch and increased protein intake (animal or plant source), fruits, and vegetables.   - Patient is advised to stick to a routine mealtimes to eat 3 meals  a day and avoid unnecessary snacks ( to snack only to correct hypoglycemia).  -He has done very well on premixed insulin  Novolin 70/30.    -Given hisstable glycemic profile, he is advised to continue his Novolin 70/30 10 units with breakfast and 10 units with supper.  -He is encouraged to monitor blood glucose at least 3 times per day, before injecting insulin at breakfast and supper, and before bed and call the clinic if  readings are less than 70 or greater than 200 for 3 tests in a row.  -He is not  a candidate for SGLT2 inhibitors due to CKD.  He is advised to discontinue naproxen he is taking for pain which may worsen his renal function.  - he will be considered for incretin therapy as appropriate next visit.  2) Lipids/HPL:  His most recent lipid panel from 12/06/20 shows controlled LDL at 85.  He is advised to continue Pravastatin 20 mg po daily at bedtime.  Side effects and precautions discussed with patient.  He has also since adopted a healthier diet, limiting fried foods and butter.  3) Hypothyroidism: -His previsit thyroid function tests are consistent with under replacement. He reports compliance with his medication, taking it properly every day.  He is advised to increase his Levothyroxine to 200 mcg po daily before breakfast.   - We discussed about the correct intake of his thyroid hormone, on empty stomach at fasting, with water, separated by at least 30 minutes from breakfast and other medications,  and separated by more than 4 hours from calcium, iron, multivitamins, acid reflux medications (PPIs). -Patient is made aware of the fact that thyroid hormone replacement is needed for life, dose to be adjusted by periodic monitoring of thyroid function tests.  4) Hypertension His blood pressure is not controlled to target. He is advised to continue Lasix 20 mg po every other day, Coreg 6.25 mg po twice daily. He is following with nephrology for stage 4 CKD (no desire to go on HD).  Will defer to nephrology for med changes.  5) Weight management:  His Body mass index is 40.4 kg/m.-- clearly complicating his diabetes care. He is a candidate for modest weight loss.  Exercise and carbs regimen discussed with him in detail.   - I advised patient to maintain close follow up with Roderic Scarce, MD for primary care needs.  - Time spent on this patient care encounter:  35 min, of which > 50% was spent in   counseling and the rest reviewing his blood glucose logs , discussing his hypoglycemia and hyperglycemia episodes, reviewing his current and  previous labs / studies  ( including abstraction from other facilities) and medications  doses and developing a  long term treatment plan and documenting his care.   Please refer to Patient Instructions for Blood Glucose Monitoring and Insulin/Medications Dosing Guide"  in media tab for additional information. Please  also refer to " Patient Self Inventory" in the Media  tab for reviewed elements of pertinent patient history.  Tien Khoshaba participated in the discussions, expressed understanding, and voiced agreement with the above plans.  All questions were answered to his satisfaction. he is encouraged to contact clinic should he have any questions or concerns prior to his return visit.   Follow up plan: - Return in about 6 months (around 06/12/2021) for Diabetes follow up with A1c in office, Thyroid follow up, Previsit labs, Bring glucometer and logs.  Rayetta Pigg, [facility] [address] [place], [place] [address] Phone: [phone] Fax: [phone]  12/13/2020, 10:47 AM

## 2021-02-24 ENCOUNTER — Other Ambulatory Visit: Payer: Self-pay | Admitting: Nurse Practitioner

## 2021-02-24 DIAGNOSIS — Z794 Long term (current) use of insulin: Secondary | ICD-10-CM

## 2021-02-24 DIAGNOSIS — E1122 Type 2 diabetes mellitus with diabetic chronic kidney disease: Secondary | ICD-10-CM

## 2021-04-13 ENCOUNTER — Other Ambulatory Visit: Payer: Self-pay | Admitting: Nurse Practitioner

## 2021-04-13 DIAGNOSIS — Z794 Long term (current) use of insulin: Secondary | ICD-10-CM

## 2021-04-13 DIAGNOSIS — N1831 Chronic kidney disease, stage 3a: Secondary | ICD-10-CM

## 2021-06-08 LAB — TSH: TSH: 7.11 u[IU]/mL — ABNORMAL HIGH (ref 0.450–4.500)

## 2021-06-08 LAB — T4, FREE: Free T4: 1.32 ng/dL (ref 0.82–1.77)

## 2021-06-10 NOTE — Patient Instructions (Signed)

## 2021-06-13 ENCOUNTER — Other Ambulatory Visit: Payer: Self-pay

## 2021-06-13 ENCOUNTER — Ambulatory Visit (INDEPENDENT_AMBULATORY_CARE_PROVIDER_SITE_OTHER): Payer: Medicare Other | Admitting: Nurse Practitioner

## 2021-06-13 ENCOUNTER — Ambulatory Visit: Payer: Medicare Other | Admitting: Nurse Practitioner

## 2021-06-13 ENCOUNTER — Encounter: Payer: Self-pay | Admitting: Nurse Practitioner

## 2021-06-13 VITALS — BP 150/68 | HR 60 | Ht 68.5 in | Wt 259.2 lb

## 2021-06-13 DIAGNOSIS — E782 Mixed hyperlipidemia: Secondary | ICD-10-CM | POA: Diagnosis not present

## 2021-06-13 DIAGNOSIS — N184 Chronic kidney disease, stage 4 (severe): Secondary | ICD-10-CM | POA: Diagnosis not present

## 2021-06-13 DIAGNOSIS — E1122 Type 2 diabetes mellitus with diabetic chronic kidney disease: Secondary | ICD-10-CM | POA: Diagnosis not present

## 2021-06-13 DIAGNOSIS — Z794 Long term (current) use of insulin: Secondary | ICD-10-CM | POA: Diagnosis not present

## 2021-06-13 DIAGNOSIS — E039 Hypothyroidism, unspecified: Secondary | ICD-10-CM | POA: Diagnosis not present

## 2021-06-13 DIAGNOSIS — I10 Essential (primary) hypertension: Secondary | ICD-10-CM | POA: Diagnosis not present

## 2021-06-13 LAB — POCT GLYCOSYLATED HEMOGLOBIN (HGB A1C): HbA1c, POC (controlled diabetic range): 6 % (ref 0.0–7.0)

## 2021-06-13 NOTE — Progress Notes (Signed)
06/13/2021  Endocrinology follow-up note  Subjective:    Patient ID: Bryce Mejia, male    DOB: 06/08/1946.  he is being seen in follow-up  in the management of currently uncontrolled symptomatic type 2 diabetes, hypothyroidism, hyperlipidemia, hypertension.   PMD:  Roderic Scarce, MD.   Past Medical History:  Diagnosis Date   Chronic kidney disease, stage 3 ([place])    CKD stage 3   Diabetes mellitus, type II ([place])    Family history of adverse reaction to anesthesia    Mom- difficult to wake up from anesthesia   H/O atrial flutter    s/p DCCV 08/01/17   Heart attack [facility])    Sleep apnea    Past Surgical History:  Procedure Laterality Date   ABLATION SAPHENOUS VEIN W/ RFA     Saphenous vein endovascular radiofrequency ablation: right smaller saphenous vein 03/13/16 and left small spahenous vein 03/27/16; left GSV RFA 01/08/15, right GSA RFA 01/22/15 ([place])   CARDIAC CATHETERIZATION     04/17/14 ([place]): normal LM, 50% mid LAD, multiple 40-50% distal LAD lesions, occluded mid CX, s/p PROMUS stent, 80% ostial AV branch, long 50% stenosis proximal RCA-mid RCA, 25-30% PDA. Medical tx for LAD and RCA disease Cleora Fleet, MD)   CARPAL TUNNEL RELEASE     NOSE SURGERY     REPLACEMENT TOTAL KNEE     TOTAL KNEE ARTHROPLASTY Right 06/24/2019   TOTAL KNEE ARTHROPLASTY Right 06/24/2019   Procedure: RIGHT TOTAL KNEE ARTHROPLASTY;  Surgeon: Mcarthur Rossetti, MD;  Location: [place];  Service: Orthopedics;  Laterality: Right;   Social History   Socioeconomic History   Marital status: Widowed    Spouse name: Not on file   Number of children: Not on file   Years of education: Not on file   Highest education level: Not on file  Occupational History   Not on file  Tobacco Use   Smoking status: Former    Types: Cigarettes    Quit date: 08/16/2007    Years since quitting: 13.8   Smokeless tobacco: Never  Vaping Use   Vaping Use: Never used  Substance and Sexual  Activity   Alcohol use: Yes    Comment: Occassional   Drug use: No   Sexual activity: Not on file  Other Topics Concern   Not on file  Social History Narrative   Not on file   Social Determinants of Health   Financial Resource Strain: Not on file  Food Insecurity: Not on file  Transportation Needs: Not on file  Physical Activity: Not on file  Stress: Not on file  Social Connections: Not on file   Outpatient Encounter Medications as of 06/13/2021  Medication Sig   enoxaparin (LOVENOX) 120 MG/0.8ML injection Inject 120 mg into the skin every 12 (twelve) hours.   LOKELMA 10 g PACK packet as needed.   acetaminophen (TYLENOL) 500 MG tablet Take 2 tablets by mouth at bedtime.   albuterol (VENTOLIN HFA) 108 (90 Base) MCG/ACT inhaler albuterol sulfate HFA 90 mcg/actuation aerosol inhaler  INHALE 1 PUFF BY MOUTH EVERY 4 HOURS AS NEEDED   apixaban (ELIQUIS) 5 MG TABS tablet Take 5 mg by mouth 2 (two) times daily. (Patient not taking: Reported on 06/13/2021)   Ascorbic Acid (VITAMIN C CR) 1000 MG TBCR Take 1 tablet by mouth daily. (Patient not taking: Reported on 06/13/2021)   aspirin EC 81 MG tablet Take 81 mg by mouth daily.   b complex vitamins capsule Take  1 capsule by mouth daily.   carvedilol (COREG) 6.25 MG tablet carvedilol 6.25 mg tablet  TAKE 1 TABLET BY MOUTH TWICE DAILY WITH FOOD (Patient not taking: Reported on 06/13/2021)   cetirizine (ZYRTEC) 10 MG tablet Take 10 mg by mouth daily.   Cholecalciferol (VITAMIN D) 2000 units CAPS Take 2,000 Units by mouth daily.    clonazePAM (KLONOPIN) 0.5 MG tablet Take 0.25-0.5 mg by mouth 2 (two) times daily as needed for anxiety.  (Patient not taking: Reported on 06/13/2021)   diphenhydrAMINE (BENADRYL) 25 MG tablet Take 25 mg by mouth at bedtime.   docusate sodium (COLACE) 100 MG capsule Take 100 mg by mouth 2 (two) times daily.   escitalopram (LEXAPRO) 20 MG tablet escitalopram 20 mg tablet  TAKE 1 TABLET BY MOUTH ONCE DAILY   finasteride  (PROSCAR) 5 MG tablet Take 5 mg by mouth daily.   Flaxseed, Linseed, (FLAXSEED OIL) 1000 MG CAPS Take 1 capsule by mouth daily. (Patient not taking: Reported on 06/13/2021)   fluticasone (FLONASE) 50 MCG/ACT nasal spray Place 2 sprays into both nostrils daily as needed for allergies or rhinitis.    furosemide (LASIX) 20 MG tablet Take 20 mg by mouth every other day.   HYDROcodone-acetaminophen (NORCO) 5-325 MG tablet Take 1 tablet by mouth every 6 (six) hours as needed for moderate pain. One to two tabs every 4-6 hours for pain   levothyroxine (EUTHYROX) 200 MCG tablet Take 1 tablet (200 mcg total) by mouth daily before breakfast.   losartan (COZAAR) 100 MG tablet Take 100 mg by mouth daily.   Multiple Vitamins-Minerals (OCUVITE ADULT 50+ PO) Take 1 tablet by mouth daily.    NOVOLIN 70/30 RELION (70-30) 100 UNIT/ML injection INJECT 10 UNITS SUBCUTANEOUSLY TWICE DAILY WITH A MEAL (Patient taking differently: 10-15 Units 2 (two) times daily with a meal.)   Placebo (CHERRY CONCENTRATE) CONC Take 500 mg by mouth daily.   pravastatin (PRAVACHOL) 20 MG tablet Take 20 mg by mouth every other day.    terazosin (HYTRIN) 10 MG capsule Take 10 mg by mouth at bedtime.   traZODone (DESYREL) 100 MG tablet Take by mouth. (Patient not taking: Reported on 06/13/2021)   triamcinolone (KENALOG) 0.1 % triamcinolone acetonide 0.1 % topical cream  APPLY TOPICALLY TWICE DAILY   zinc gluconate 50 MG tablet Take 50 mg by mouth daily. (Patient not taking: Reported on 06/13/2021)   No facility-administered encounter medications on file as of 06/13/2021.    ALLERGIES: No Known Allergies  VACCINATION STATUS:  There is no immunization history on file for this patient.  Diabetes He presents for his follow-up diabetic visit. He has type 2 diabetes mellitus. Onset time: He was diagnosed at approximate age of 75 years. His disease course has been stable. Hypoglycemia symptoms include nervousness/anxiousness, sweats and tremors.  Pertinent negatives for hypoglycemia include no confusion, headaches, pallor or seizures. Pertinent negatives for diabetes include no chest pain, no fatigue, no polydipsia, no polyphagia, no polyuria and no weakness. There are no hypoglycemic complications. Symptoms are stable. Diabetic complications include heart disease and nephropathy. (ESRD- now on HD) Risk factors for coronary artery disease include dyslipidemia, diabetes mellitus, hypertension, male sex, obesity, sedentary lifestyle and tobacco exposure. Current diabetic treatment includes insulin injections. He is compliant with treatment most of the time. His weight is decreasing steadily (now on HD). He is following a generally healthy and diabetic diet. When asked about meal planning, he reported none. He has not had a previous visit with a dietitian. He  never participates in exercise. His home blood glucose trend is fluctuating minimally. His breakfast blood glucose range is generally 130-140 mg/dl. His dinner blood glucose range is generally 130-140 mg/dl. (He presents today with his logs, no meter, showing stable glycemic profile both fasting and postprandially.  His POCT A1c today is 6%, essentially unchanged from previous visit.  He is known to have some discrepancy with his glucose readings and A1c due to chronic anemia.  He has been hospitalized numerous times since last visit for sepsis and multiple blood clots to his lungs.  He was also started on hemodialysis since last visit (which he was on the fence about before).  He reports one incidence of low glucose, associated with meal timing.) An ACE inhibitor/angiotensin II receptor blocker is being taken. He does not see a podiatrist.Eye exam is current.  Hyperlipidemia This is a chronic problem. The current episode started more than 1 year ago. The problem is controlled. Recent lipid tests were reviewed and are normal. Exacerbating diseases include chronic renal disease, diabetes, hypothyroidism  and obesity. Factors aggravating his hyperlipidemia include fatty foods. Pertinent negatives include no chest pain, myalgias or shortness of breath. Current antihyperlipidemic treatment includes statins. The current treatment provides mild improvement of lipids. Compliance problems include adherence to exercise and adherence to diet.  Risk factors for coronary artery disease include diabetes mellitus, dyslipidemia, hypertension, male sex, obesity and a sedentary lifestyle.  Hypertension This is a chronic problem. The current episode started more than 1 year ago. The problem has been gradually improving since onset. The problem is uncontrolled. Associated symptoms include sweats. Pertinent negatives include no chest pain, headaches, neck pain, palpitations or shortness of breath. Agents associated with hypertension include thyroid hormones. Risk factors for coronary artery disease include dyslipidemia, diabetes mellitus, male gender, obesity, sedentary lifestyle and smoking/tobacco exposure. Past treatments include ACE inhibitors, diuretics and alpha 1 blockers. The current treatment provides mild improvement. Compliance problems include exercise and diet.  Hypertensive end-organ damage includes kidney disease, CAD/MI and heart failure. Identifiable causes of hypertension include chronic renal disease.   Review of systems  Constitutional: + steadily decreasing body weight,  current Body mass index is 38.84 kg/m. , no fatigue, no subjective hyperthermia, no subjective hypothermia Eyes: no blurry vision, no xerophthalmia ENT: no sore throat, no nodules palpated in throat, no dysphagia/odynophagia, no hoarseness Cardiovascular: no chest pain, no shortness of breath, no palpitations, no leg swelling Respiratory: no cough, no shortness of breath Gastrointestinal: no nausea/vomiting/diarrhea Musculoskeletal: no muscle/joint aches Skin: no rashes, no hyperemia Neurological: no tremors, no numbness, no  tingling, no dizziness Psychiatric: no depression, no anxiety    Objective:    BP (!) 150/68   Pulse 60   Ht 5' 8.5" (1.74 m)   Wt 259 lb 3.2 oz (117.6 kg)   BMI 38.84 kg/m   Wt Readings from Last 3 Encounters:  06/13/21 259 lb 3.2 oz (117.6 kg)  12/13/20 269 lb 9.6 oz (122.3 kg)  08/09/20 260 lb (117.9 kg)    BP Readings from Last 3 Encounters:  06/13/21 (!) 150/68  12/13/20 (!) 177/83  08/09/20 (!) 158/75     Physical Exam- Limited  Constitutional:  Body mass index is 38.84 kg/m. , not in acute distress, normal state of mind Eyes:  EOMI, no exophthalmos Neck: Supple Cardiovascular: RRR, no murmurs, rubs, or gallops, no edema Respiratory: Adequate breathing efforts, no crackles, rales, rhonchi, or wheezing Musculoskeletal: no gross deformities, strength intact in all four extremities, no gross restriction of  joint movements Skin:  no rashes, no hyperemia Neurological: no tremor with outstretched hands    CMP     Component Value Date/Time   NA 144 12/06/2020 1112   K 5.5 (H) 12/06/2020 1112   CL 113 (H) 12/06/2020 1112   CO2 17 (L) 12/06/2020 1112   GLUCOSE 129 (H) 12/06/2020 1112   GLUCOSE 124 12/01/2019 1514   BUN 53 (H) 12/06/2020 1112   CREATININE 3.17 (H) 12/06/2020 1112   CREATININE 2.07 (H) 12/01/2019 1514   CALCIUM 8.5 (L) 12/06/2020 1112   PROT 5.9 (L) 12/06/2020 1112   ALBUMIN 3.7 12/06/2020 1112   AST 19 12/06/2020 1112   ALT 19 12/06/2020 1112   ALKPHOS 79 12/06/2020 1112   BILITOT <0.2 12/06/2020 1112   GFRNONAA 18 (L) 12/06/2020 1112   GFRNONAA 31 (L) 12/01/2019 1514   GFRAA 21 (L) 12/06/2020 1112   GFRAA 36 (L) 12/01/2019 1514    Lipid Panel     Component Value Date/Time   CHOL 142 12/06/2020 1112   TRIG 97 12/06/2020 1112   HDL 39 (L) 12/06/2020 1112   CHOLHDL 3.6 12/06/2020 1112   CHOLHDL 2.8 05/13/2018 1554   LDLCALC 85 12/06/2020 1112   LDLCALC 48 05/13/2018 1554   LABVLDL 18 12/06/2020 1112   Results for LINCON, TASSY  (MRN [id number]) as of 08/09/2020 09:46  Ref. Range 06/20/2019 12:11 06/25/2019 03:44 12/01/2019 15:14 04/08/2020 10:12 04/16/2020 00:00  eAG (mmol/L) Latest Units: (calc)   7.4    Glucose Latest Ref Range: 65 - 139 mg/dL 144 (H) 173 (H) 124    Hemoglobin A1C Latest Ref Range: 4.0 - 5.6 %   6.3 (H) 6.2 (A)   TSH Latest Ref Range: 0.41 - 5.90    1.11  0.73  T4,Free(Direct) Latest Ref Range: 0.8 - 1.8 ng/dL   1.1       Assessment & Plan:   1) DM type 2 causing vascular disease (HCC)  - Patient has currently uncontrolled symptomatic type 2 DM since  75 years of age.  He presents today with his logs, no meter, showing stable glycemic profile both fasting and postprandially.  His POCT A1c today is 6%, essentially unchanged from previous visit.  He is known to have some discrepancy with his glucose readings and A1c due to chronic anemia.  He has been hospitalized numerous times since last visit for sepsis and multiple blood clots to his lungs.  He was also started on hemodialysis since last visit (which he was on the fence about before).  He reports one incidence of low glucose, associated with meal timing.  -his diabetes is complicated by coronary artery disease, CKD, obesity/sedentary life and Blayden Polt remains at a high risk for more acute and chronic complications which include CAD, CVA, CKD, retinopathy, and neuropathy. These are all discussed in detail with the patient.  - Nutritional counseling repeated at each appointment due to patients tendency to fall back in to old habits.  - The patient admits there is a room for improvement in their diet and drink choices. -  Suggestion is made for the patient to avoid simple carbohydrates from their diet including Cakes, Sweet Desserts / Pastries, Ice Cream, Soda (diet and regular), Sweet Tea, Candies, Chips, Cookies, Sweet Pastries, Store Bought Juices, Alcohol in Excess of 1-2 drinks a day, Artificial Sweeteners, Coffee Creamer, and "Sugar-free"  Products. This will help patient to have stable blood glucose profile and potentially avoid unintended weight gain.   - I encouraged the  patient to switch to unprocessed or minimally processed complex starch and increased protein intake (animal or plant source), fruits, and vegetables.   - Patient is advised to stick to a routine mealtimes to eat 3 meals a day and avoid unnecessary snacks (to snack only to correct hypoglycemia).  -He has done very well on premixed insulin  Novolin 70/30.    -Based on his stable glycemic profile, he is advised to continue his Novolin 70/30 15 units with breakfast and 10 units with supper if glucose is above 90 and he is eating.    -He is encouraged to monitor blood glucose at least 3 times per day, before injecting insulin at breakfast and supper, and before bed and call the clinic if readings are less than 70 or greater than 200 for 3 tests in a row.  -He is not a candidate for SGLT2 inhibitors due to CKD.  He is advised to discontinue naproxen he is taking for pain which may worsen his renal function.  - he will be considered for incretin therapy as appropriate next visit.  2) Lipids/HPL:  His most recent lipid panel from 12/06/20 shows controlled LDL at 85.  He is advised to continue Pravastatin 20 mg po daily at bedtime.  Side effects and precautions discussed with patient.  He has also since adopted a healthier diet, limiting fried foods and butter.  3) Hypothyroidism: -His previsit thyroid function tests show improvement, still slightly above target TSH likely due to HD.  He is advised to continue Levothyroxine 200 mcg po daily before breakfast.     - We discussed about the correct intake of his thyroid hormone, on empty stomach at fasting, with water, separated by at least 30 minutes from breakfast and other medications,  and separated by more than 4 hours from calcium, iron, multivitamins, acid reflux medications (PPIs). -Patient is made aware of the  fact that thyroid hormone replacement is needed for life, dose to be adjusted by periodic monitoring of thyroid function tests.  4) Hypertension His blood pressure is not controlled to target. He is advised to continue Lasix 20 mg po every other day, Coreg 6.25 mg po twice daily. He is following with nephrology for stage 4 CKD currently on HD.  Will defer to nephrology for med changes.  5) Weight management:  His Body mass index is 38.84 kg/m.-- clearly complicating his diabetes care. He is a candidate for modest weight loss.  Exercise and carbs regimen discussed with him in detail.   - I advised patient to maintain close follow up with Roderic Scarce, MD for primary care needs.    I spent 40 minutes in the care of the patient today including review of labs from [place], Lipids, Thyroid Function, Hematology (current and previous including abstractions from other facilities); face-to-face time discussing  his blood glucose readings/logs, discussing hypoglycemia and hyperglycemia episodes and symptoms, medications doses, his options of short and long term treatment based on the latest standards of care / guidelines;  discussion about incorporating lifestyle medicine;  and documenting the encounter.    Please refer to Patient Instructions for Blood Glucose Monitoring and Insulin/Medications Dosing Guide"  in media tab for additional information. Please  also refer to " Patient Self Inventory" in the Media  tab for reviewed elements of pertinent patient history.  Kyian Teigen participated in the discussions, expressed understanding, and voiced agreement with the above plans.  All questions were answered to his satisfaction. he is encouraged to contact clinic should  he have any questions or concerns prior to his return visit.   Follow up plan: - Return in about 6 months (around 12/14/2021) for Diabetes F/U with A1c in office, Thyroid follow up, Previsit labs, Bring meter and logs.  Rayetta Pigg,  [facility] [address] [place], [place] [address] Phone: [phone] Fax: [phone]  06/13/2021, 2:46 PM

## 2021-07-13 ENCOUNTER — Other Ambulatory Visit: Payer: Self-pay | Admitting: Nurse Practitioner

## 2021-07-13 DIAGNOSIS — E1122 Type 2 diabetes mellitus with diabetic chronic kidney disease: Secondary | ICD-10-CM

## 2021-07-13 DIAGNOSIS — N1831 Chronic kidney disease, stage 3a: Secondary | ICD-10-CM

## 2021-10-03 ENCOUNTER — Other Ambulatory Visit: Payer: Self-pay | Admitting: Nurse Practitioner

## 2021-10-03 DIAGNOSIS — N1831 Chronic kidney disease, stage 3a: Secondary | ICD-10-CM

## 2021-12-13 ENCOUNTER — Other Ambulatory Visit: Payer: Self-pay | Admitting: Nurse Practitioner

## 2021-12-13 DIAGNOSIS — N1831 Chronic kidney disease, stage 3a: Secondary | ICD-10-CM

## 2021-12-13 DIAGNOSIS — E1122 Type 2 diabetes mellitus with diabetic chronic kidney disease: Secondary | ICD-10-CM

## 2021-12-21 ENCOUNTER — Other Ambulatory Visit: Payer: Self-pay | Admitting: Nurse Practitioner

## 2021-12-21 ENCOUNTER — Other Ambulatory Visit: Payer: Self-pay

## 2021-12-21 DIAGNOSIS — E039 Hypothyroidism, unspecified: Secondary | ICD-10-CM

## 2021-12-22 LAB — TSH: TSH: 3.56 u[IU]/mL (ref 0.450–4.500)

## 2021-12-22 LAB — T4, FREE: Free T4: 1.25 ng/dL (ref 0.82–1.77)

## 2021-12-26 NOTE — Patient Instructions (Signed)

## 2021-12-27 ENCOUNTER — Other Ambulatory Visit: Payer: Self-pay

## 2021-12-27 ENCOUNTER — Encounter: Payer: Self-pay | Admitting: Nurse Practitioner

## 2021-12-27 ENCOUNTER — Ambulatory Visit (INDEPENDENT_AMBULATORY_CARE_PROVIDER_SITE_OTHER): Payer: Medicare Other | Admitting: Nurse Practitioner

## 2021-12-27 ENCOUNTER — Encounter: Payer: Medicare Other | Admitting: Nurse Practitioner

## 2021-12-27 VITALS — Ht 68.5 in

## 2021-12-27 VITALS — BP 143/64 | HR 66 | Ht 68.5 in | Wt 260.6 lb

## 2021-12-27 DIAGNOSIS — E782 Mixed hyperlipidemia: Secondary | ICD-10-CM

## 2021-12-27 DIAGNOSIS — N184 Chronic kidney disease, stage 4 (severe): Secondary | ICD-10-CM

## 2021-12-27 DIAGNOSIS — Z794 Long term (current) use of insulin: Secondary | ICD-10-CM

## 2021-12-27 DIAGNOSIS — I10 Essential (primary) hypertension: Secondary | ICD-10-CM

## 2021-12-27 DIAGNOSIS — E1122 Type 2 diabetes mellitus with diabetic chronic kidney disease: Secondary | ICD-10-CM | POA: Diagnosis not present

## 2021-12-27 DIAGNOSIS — E039 Hypothyroidism, unspecified: Secondary | ICD-10-CM

## 2021-12-27 LAB — POCT GLYCOSYLATED HEMOGLOBIN (HGB A1C): HbA1c, POC (controlled diabetic range): 7.4 % — AB (ref 0.0–7.0)

## 2021-12-27 NOTE — Progress Notes (Signed)
12/27/2021  Endocrinology follow-up note  Subjective:    Patient ID: Bryce Mejia, male    DOB: [birth year].  he is being seen in follow-up  in the management of currently uncontrolled symptomatic type 2 diabetes, hypothyroidism, hyperlipidemia, hypertension.   PMD:  Roderic Scarce, MD.   Past Medical History:  Diagnosis Date   Chronic kidney disease, stage 3 ([place])    CKD stage 3   Diabetes mellitus, type II ([place])    Family history of adverse reaction to anesthesia    Mom- difficult to wake up from anesthesia   H/O atrial flutter    s/p DCCV 08/01/17   Heart attack [facility])    Sleep apnea    Past Surgical History:  Procedure Laterality Date   ABLATION SAPHENOUS VEIN W/ RFA     Saphenous vein endovascular radiofrequency ablation: right smaller saphenous vein 03/13/16 and left small spahenous vein 03/27/16; left GSV RFA 01/08/15, right GSA RFA 01/22/15 ([place])   CARDIAC CATHETERIZATION     04/17/14 ([place]): normal LM, 50% mid LAD, multiple 40-50% distal LAD lesions, occluded mid CX, s/p PROMUS stent, 80% ostial AV branch, long 50% stenosis proximal RCA-mid RCA, 25-30% PDA. Medical tx for LAD and RCA disease Cleora Fleet, MD)   CARPAL TUNNEL RELEASE     NOSE SURGERY     REPLACEMENT TOTAL KNEE     TOTAL KNEE ARTHROPLASTY Right 06/24/2019   TOTAL KNEE ARTHROPLASTY Right 06/24/2019   Procedure: RIGHT TOTAL KNEE ARTHROPLASTY;  Surgeon: Mcarthur Rossetti, MD;  Location: [facility];  Service: Orthopedics;  Laterality: Right;   Social History   Socioeconomic History   Marital status: Widowed    Spouse name: Not on file   Number of children: Not on file   Years of education: Not on file   Highest education level: Not on file  Occupational History   Not on file  Tobacco Use   Smoking status: Former    Types: Cigarettes    Quit date: 08/16/2007    Years since quitting: 14.3   Smokeless tobacco: Never  Vaping Use   Vaping Use: Never used  Substance and  Sexual Activity   Alcohol use: Yes    Comment: Occassional   Drug use: No   Sexual activity: Not on file  Other Topics Concern   Not on file  Social History Narrative   Not on file   Social Determinants of Health   Financial Resource Strain: Not on file  Food Insecurity: Not on file  Transportation Needs: Not on file  Physical Activity: Not on file  Stress: Not on file  Social Connections: Not on file   Outpatient Encounter Medications as of 12/27/2021  Medication Sig   acetaminophen (TYLENOL) 500 MG tablet Take 2 tablets by mouth at bedtime.   albuterol (VENTOLIN HFA) 108 (90 Base) MCG/ACT inhaler albuterol sulfate HFA 90 mcg/actuation aerosol inhaler  INHALE 1 PUFF BY MOUTH EVERY 4 HOURS AS NEEDED   aspirin EC 81 MG tablet Take 81 mg by mouth daily.   cetirizine (ZYRTEC) 10 MG tablet Take 10 mg by mouth daily.   Cholecalciferol (VITAMIN D) 2000 units CAPS Take 2,000 Units by mouth daily.    clonazePAM (KLONOPIN) 0.5 MG tablet Take 0.25-0.5 mg by mouth 2 (two) times daily as needed for anxiety.   diphenhydrAMINE (BENADRYL) 25 MG tablet Take 25 mg by mouth at bedtime.   docusate sodium (COLACE) 100 MG capsule Take 100 mg by mouth 2 (two) times  daily.   escitalopram (LEXAPRO) 20 MG tablet escitalopram 20 mg tablet  TAKE 1 TABLET BY MOUTH ONCE DAILY   finasteride (PROSCAR) 5 MG tablet Take 5 mg by mouth daily.   fluticasone (FLONASE) 50 MCG/ACT nasal spray Place 2 sprays into both nostrils daily as needed for allergies or rhinitis.    furosemide (LASIX) 80 MG tablet Take 80 mg by mouth every other day.   HYDROcodone-acetaminophen (NORCO) 5-325 MG tablet Take 1 tablet by mouth every 6 (six) hours as needed for moderate pain. One to two tabs every 4-6 hours for pain   ipratropium (ATROVENT) 0.03 % nasal spray Place 2 sprays into both nostrils 3 (three) times daily.   levothyroxine (EUTHYROX) 200 MCG tablet Take 1 tablet (200 mcg total) by mouth daily before breakfast.   lidocaine  (XYLOCAINE) 2 % solution SMARTSIG:20 Milliliter(s) By Mouth 3 Times Daily PRN   losartan (COZAAR) 100 MG tablet Take 100 mg by mouth daily.   metoprolol succinate (TOPROL-XL) 25 MG 24 hr tablet Take 12.5 mg by mouth daily.   Multiple Vitamins-Minerals (OCUVITE ADULT 50+ PO) Take 1 tablet by mouth daily.    Multiple Vitamins-Minerals (RENAPLEX-D) TABS Take by mouth.   NOVOLIN 70/30 RELION (70-30) 100 UNIT/ML injection INJECT 10 TO 15 UNITS SUBCUTANEOUSLY TWICE DAILY WITH A MEAL   pantoprazole (PROTONIX) 40 MG tablet Take 40 mg by mouth 2 (two) times daily.   Placebo (CHERRY CONCENTRATE) CONC Take 500 mg by mouth daily.   pravastatin (PRAVACHOL) 20 MG tablet Take 20 mg by mouth every other day.    rOPINIRole (REQUIP) 0.25 MG tablet Take 0.25 mg by mouth every morning.   terazosin (HYTRIN) 10 MG capsule Take 10 mg by mouth at bedtime.   tiZANidine (ZANAFLEX) 4 MG tablet tizanidine 4 mg tablet  TAKE 1 TABLET BY MOUTH THREE TIMES DAILY FOR AS NEEDED FOR MUSCLE SPASMS   triamcinolone (KENALOG) 0.1 % triamcinolone acetonide 0.1 % topical cream  APPLY TOPICALLY TWICE DAILY   warfarin (COUMADIN) 5 MG tablet Take 5 mg by mouth daily. Takes 1/2 tab on Monday and Wednesday   [DISCONTINUED] apixaban (ELIQUIS) 5 MG TABS tablet Take 5 mg by mouth 2 (two) times daily. (Patient not taking: Reported on 06/13/2021)   [DISCONTINUED] Ascorbic Acid (VITAMIN C CR) 1000 MG TBCR Take 1 tablet by mouth daily. (Patient not taking: Reported on 06/13/2021)   [DISCONTINUED] b complex vitamins capsule Take 1 capsule by mouth daily. (Patient not taking: Reported on 12/27/2021)   [DISCONTINUED] carvedilol (COREG) 6.25 MG tablet carvedilol 6.25 mg tablet  TAKE 1 TABLET BY MOUTH TWICE DAILY WITH FOOD (Patient not taking: Reported on 06/13/2021)   [DISCONTINUED] enoxaparin (LOVENOX) 120 MG/0.8ML injection Inject 120 mg into the skin every 12 (twelve) hours. (Patient not taking: Reported on 12/27/2021)   [DISCONTINUED] Flaxseed, Linseed,  (FLAXSEED OIL) 1000 MG CAPS Take 1 capsule by mouth daily. (Patient not taking: Reported on 06/13/2021)   [DISCONTINUED] furosemide (LASIX) 20 MG tablet Take 20 mg by mouth every other day. (Patient not taking: Reported on 12/27/2021)   [DISCONTINUED] LOKELMA 10 g PACK packet as needed. (Patient not taking: Reported on 12/27/2021)   [DISCONTINUED] traZODone (DESYREL) 100 MG tablet Take by mouth. (Patient not taking: Reported on 06/13/2021)   [DISCONTINUED] zinc gluconate 50 MG tablet Take 50 mg by mouth daily. (Patient not taking: Reported on 06/13/2021)   No facility-administered encounter medications on file as of 12/27/2021.    ALLERGIES: Allergies  Allergen Reactions   Acai  Other reaction(s): Other (see comments), Unknown   Nsaids     Other reaction(s): renal insufficiency Told to avoid - at risk for kidney failure, does take aspirin 81 mg   Sulfa Antibiotics     Other reaction(s): Other (See Comments), renal insufficiency Told to avoid - at risk of renal failure Can not have sulfa per nephrology because of kidneys      VACCINATION STATUS:  There is no immunization history on file for this patient.  Diabetes He presents for his follow-up diabetic visit. He has type 2 diabetes mellitus. Onset time: He was diagnosed at approximate age of 3 years. His disease course has been stable. There are no hypoglycemic associated symptoms. Pertinent negatives for hypoglycemia include no confusion, headaches, pallor or seizures. Pertinent negatives for diabetes include no chest pain, no fatigue, no polydipsia, no polyphagia, no polyuria and no weakness. There are no hypoglycemic complications. Symptoms are stable. Diabetic complications include heart disease and nephropathy. (ESRD- now on HD) Risk factors for coronary artery disease include dyslipidemia, diabetes mellitus, hypertension, male sex, obesity, sedentary lifestyle and tobacco exposure. Current diabetic treatment includes insulin injections.  He is compliant with treatment most of the time. His weight is fluctuating minimally (now on HD). He is following a generally healthy and diabetic diet. When asked about meal planning, he reported none. He has not had a previous visit with a dietitian. He never participates in exercise. His home blood glucose trend is fluctuating minimally. His breakfast blood glucose range is generally 180-200 mg/dl. His dinner blood glucose range is generally 140-180 mg/dl. (He presents today with his meter and logs showing above target fasting and at goal postprandial readings.  His POCT A1c today is 7.4%, increasing from last visit of 6%, yet still an acceptable goal for his age and comorbidities.  He says he has not been exercising optimally during the colder months and finds himself eating off his routine meal pattern.  He is asking about a CGM today.) An ACE inhibitor/angiotensin II receptor blocker is being taken. He does not see a podiatrist.Eye exam is current.  Hyperlipidemia This is a chronic problem. The current episode started more than 1 year ago. The problem is controlled. Recent lipid tests were reviewed and are normal. Exacerbating diseases include chronic renal disease, diabetes, hypothyroidism and obesity. Factors aggravating his hyperlipidemia include fatty foods. Pertinent negatives include no chest pain, myalgias or shortness of breath. Current antihyperlipidemic treatment includes statins. The current treatment provides mild improvement of lipids. Compliance problems include adherence to exercise and adherence to diet.  Risk factors for coronary artery disease include diabetes mellitus, dyslipidemia, hypertension, male sex, obesity and a sedentary lifestyle.  Hypertension This is a chronic problem. The current episode started more than 1 year ago. The problem has been gradually improving since onset. The problem is uncontrolled. Pertinent negatives include no chest pain, headaches, neck pain, palpitations  or shortness of breath. Agents associated with hypertension include thyroid hormones. Risk factors for coronary artery disease include dyslipidemia, diabetes mellitus, male gender, obesity, sedentary lifestyle and smoking/tobacco exposure. Past treatments include ACE inhibitors, diuretics and alpha 1 blockers. The current treatment provides mild improvement. Compliance problems include exercise and diet.  Hypertensive end-organ damage includes kidney disease, CAD/MI and heart failure. Identifiable causes of hypertension include chronic renal disease.   Review of systems  Constitutional: + Minimally fluctuating body weight,  current Body mass index is 39.05 kg/m. , no fatigue, no subjective hyperthermia, no subjective hypothermia Eyes: no blurry vision, no xerophthalmia  ENT: no sore throat, no nodules palpated in throat, no dysphagia/odynophagia, no hoarseness Cardiovascular: no chest pain, no shortness of breath, no palpitations, no leg swelling Respiratory: no cough, no shortness of breath Gastrointestinal: no nausea/vomiting/diarrhea Musculoskeletal: no muscle/joint aches Skin: no rashes, no hyperemia Neurological: no tremors, no numbness, no tingling, no dizziness Psychiatric: no depression, no anxiety    Objective:    BP (!) 143/64    Pulse 66    Ht 5' 8.5" (1.74 m)    Wt 260 lb 9.6 oz (118.2 kg)    SpO2 97%    BMI 39.05 kg/m   Wt Readings from Last 3 Encounters:  12/27/21 260 lb 9.6 oz (118.2 kg)  06/13/21 259 lb 3.2 oz (117.6 kg)  12/13/20 269 lb 9.6 oz (122.3 kg)    BP Readings from Last 3 Encounters:  12/27/21 (!) 143/64  06/13/21 (!) 150/68  12/13/20 (!) 177/83     Physical Exam- Limited  Constitutional:  Body mass index is 39.05 kg/m. , not in acute distress, normal state of mind Eyes:  EOMI, no exophthalmos Neck: Supple Cardiovascular: RRR, no murmurs, rubs, or gallops, no edema Respiratory: Adequate breathing efforts, no crackles, rales, rhonchi, or  wheezing Musculoskeletal: no gross deformities, strength intact in all four extremities, no gross restriction of joint movements Skin:  no rashes, no hyperemia Neurological: no tremor with outstretched hands    CMP     Component Value Date/Time   NA 144 12/06/2020 1112   K 5.5 (H) 12/06/2020 1112   CL 113 (H) 12/06/2020 1112   CO2 17 (L) 12/06/2020 1112   GLUCOSE 129 (H) 12/06/2020 1112   GLUCOSE 124 12/01/2019 1514   BUN 53 (H) 12/06/2020 1112   CREATININE 3.17 (H) 12/06/2020 1112   CREATININE 2.07 (H) 12/01/2019 1514   CALCIUM 8.5 (L) 12/06/2020 1112   PROT 5.9 (L) 12/06/2020 1112   ALBUMIN 3.7 12/06/2020 1112   AST 19 12/06/2020 1112   ALT 19 12/06/2020 1112   ALKPHOS 79 12/06/2020 1112   BILITOT <0.2 12/06/2020 1112   GFRNONAA 18 (L) 12/06/2020 1112   GFRNONAA 31 (L) 12/01/2019 1514   GFRAA 21 (L) 12/06/2020 1112   GFRAA 36 (L) 12/01/2019 1514    Lipid Panel     Component Value Date/Time   CHOL 142 12/06/2020 1112   TRIG 97 12/06/2020 1112   HDL 39 (L) 12/06/2020 1112   CHOLHDL 3.6 12/06/2020 1112   CHOLHDL 2.8 05/13/2018 1554   LDLCALC 85 12/06/2020 1112   LDLCALC 48 05/13/2018 1554   LABVLDL 18 12/06/2020 1112   Results for Bryce Mejia, Bryce Mejia (MRN [id number]) as of 08/09/2020 09:46  Ref. Range 06/20/2019 12:11 06/25/2019 03:44 12/01/2019 15:14 04/08/2020 10:12 04/16/2020 00:00  eAG (mmol/L) Latest Units: (calc)   7.4    Glucose Latest Ref Range: 65 - 139 mg/dL 144 (H) 173 (H) 124    Hemoglobin A1C Latest Ref Range: 4.0 - 5.6 %   6.3 (H) 6.2 (A)   TSH Latest Ref Range: 0.41 - 5.90    1.11  0.73  T4,Free(Direct) Latest Ref Range: 0.8 - 1.8 ng/dL   1.1       Assessment & Plan:   1) DM type 2 causing vascular disease (HCC)  - Patient has currently uncontrolled symptomatic type 2 DM since  76 years of age.  He presents today with his meter and logs showing above target fasting and at goal postprandial readings.  His POCT A1c today is 7.4%, increasing from last visit  of 6%, yet still an acceptable goal for his age and comorbidities.  He says he has not been exercising optimally during the colder months and finds himself eating off his routine meal pattern.  He is asking about a CGM today.  -his diabetes is complicated by coronary artery disease, CKD, obesity/sedentary life and Bryce Mejia remains at a high risk for more acute and chronic complications which include CAD, CVA, CKD, retinopathy, and neuropathy. These are all discussed in detail with the patient.  - Nutritional counseling repeated at each appointment due to patients tendency to fall back in to old habits.  - The patient admits there is a room for improvement in their diet and drink choices. -  Suggestion is made for the patient to avoid simple carbohydrates from their diet including Cakes, Sweet Desserts / Pastries, Ice Cream, Soda (diet and regular), Sweet Tea, Candies, Chips, Cookies, Sweet Pastries, Store Bought Juices, Alcohol in Excess of 1-2 drinks a day, Artificial Sweeteners, Coffee Creamer, and "Sugar-free" Products. This will help patient to have stable blood glucose profile and potentially avoid unintended weight gain.   - I encouraged the patient to switch to unprocessed or minimally processed complex starch and increased protein intake (animal or plant source), fruits, and vegetables.   - Patient is advised to stick to a routine mealtimes to eat 3 meals a day and avoid unnecessary snacks (to snack only to correct hypoglycemia).  -He has done very well on premixed insulin Novolin 70/30.    -Based on his stable glycemic profile, he is advised to continue his Novolin 70/30 15 units with breakfast and 10 units with supper if glucose is above 90 and he is eating.  I suspect his glucose drops too low overnight causing liver to dump sugar causing his higher morning readings.  -He is encouraged to monitor blood glucose at least 3 times per day, before injecting insulin at breakfast and  supper, and before bed and call the clinic if readings are less than 70 or greater than 200 for 3 tests in a row.  He is an excellent candidate for CGM given his age and tendency to drop.  Will send order for Dexcom through [employer].  I did give him a sample today and assisted him in setting it up.  -He is not a candidate for SGLT2 inhibitors due to CKD.  He is advised to discontinue naproxen he is taking for pain which may worsen his renal function.  - he will be considered for incretin therapy as appropriate next visit.  2) Lipids/HPL:  His most recent lipid panel from 12/06/20 shows controlled LDL at 85.  He is advised to continue Pravastatin 20 mg po daily at bedtime.  Side effects and precautions discussed with patient.  He has also since adopted a healthier diet, limiting fried foods and butter.  3) Hypothyroidism: -His previsit thyroid function tests are consistent with appropriate hormone replacement.   He is advised to continue Levothyroxine 200 mcg po daily before breakfast.     - We discussed about the correct intake of his thyroid hormone, on empty stomach at fasting, with water, separated by at least 30 minutes from breakfast and other medications,  and separated by more than 4 hours from calcium, iron, multivitamins, acid reflux medications (PPIs). -Patient is made aware of the fact that thyroid hormone replacement is needed for life, dose to be adjusted by periodic monitoring of thyroid function tests.  4) Hypertension His blood pressure is controlled to target. He  is advised to continue Lasix 80 mg po every other day, Losartan 100 mg po daily, and Metoprolol 25 mg po daily. He is following with nephrology for stage 4 CKD currently on HD.  Will defer to nephrology for med changes.  5) Weight management:  His Body mass index is 39.05 kg/m.-- clearly complicating his diabetes care. He is a candidate for modest weight loss.  Exercise and carbs regimen discussed with him in  detail.   - I advised patient to maintain close follow up with Roderic Scarce, MD for primary care needs.     I spent 41 minutes in the care of the patient today including review of labs from [place], Lipids, Thyroid Function, Hematology (current and previous including abstractions from other facilities); face-to-face time discussing  his blood glucose readings/logs, discussing hypoglycemia and hyperglycemia episodes and symptoms, medications doses, his options of short and long term treatment based on the latest standards of care / guidelines;  discussion about incorporating lifestyle medicine;  and documenting the encounter.    Please refer to Patient Instructions for Blood Glucose Monitoring and Insulin/Medications Dosing Guide"  in media tab for additional information. Please  also refer to " Patient Self Inventory" in the Media  tab for reviewed elements of pertinent patient history.  Bryce Mejia participated in the discussions, expressed understanding, and voiced agreement with the above plans.  All questions were answered to his satisfaction. he is encouraged to contact clinic should he have any questions or concerns prior to his return visit.   Follow up plan: - Return in about 3 months (around 03/26/2022) for Diabetes F/U with A1c in office, Previsit labs, Thyroid follow up, Bring meter and logs.    Rayetta Pigg, [facility] [address] [place], [place] [address] Phone: [phone] Fax: [phone]  12/27/2021, 3:18 PM

## 2021-12-28 ENCOUNTER — Encounter: Payer: Self-pay | Admitting: Nurse Practitioner

## 2021-12-28 ENCOUNTER — Telehealth: Payer: Self-pay | Admitting: Nurse Practitioner

## 2021-12-28 NOTE — Progress Notes (Signed)
Erroneous encounter

## 2021-12-29 MED ORDER — LEVOTHYROXINE SODIUM 200 MCG PO TABS: 200.0000 ug | ORAL_TABLET | Freq: Every day | ORAL | 3 refills | Status: AC

## 2021-12-29 NOTE — Telephone Encounter (Signed)
I just sent in new script

## 2021-12-29 NOTE — Addendum Note (Signed)
Addended by: Brita Romp on: 12/29/2021 08:37 AM   Modules accepted: Orders

## 2021-12-29 NOTE — Telephone Encounter (Signed)
[facility] called and states this was denied but the prescription they have on file is from last January and it has expired. He is needing a new updated one sent in

## 2022-01-02 ENCOUNTER — Telehealth: Payer: Self-pay | Admitting: Nurse Practitioner

## 2022-01-02 NOTE — Telephone Encounter (Signed)
Called the patient and let him know that I spoke to [facility] at [facility] and someone was going to contact him to finalize his order. I also gave him the number to [employer] in case he did not get a call. Patient verbalized an understanding.

## 2022-01-02 NOTE — Telephone Encounter (Signed)
Pt had dexcom put on him in office 2/15 and was told he would be hearing from someone about his supplies. He has not heard from anyone and his sensor will need to be changed this week. Requesting cb [phone]

## 2022-01-02 NOTE — Telephone Encounter (Signed)
Called [employer] and spoke with Stanton Kidney and she said everything is ready to send his order and she is going to call this patient now to process his order. I will call him a little later to let him know.

## 2022-01-03 ENCOUNTER — Telehealth: Payer: Self-pay | Admitting: Gastroenterology

## 2022-01-03 NOTE — Telephone Encounter (Signed)
I have briefly reviewed the patient's medical records.  Looks like he has been dealing with adenomatous colon polyps as well as evidence of Barrett's esophagus.  Most recent endoscopy showed Barrett's esophagus with low-grade dysplasia and patient also had evidence of a colon polyp.  Patient was to be referred to [facility] for consideration of RFA.  Patient is asking for transition of care here.  I am willing to meet the patient at to continue a transition of care as well as discussion about Barrett's esophagus and potential therapies that may be offered.  Patient can be scheduled on a day with a held slot or a 3:50 PM slot.  You may schedule that in the next 4 to 6 weeks.  I will give these records to be formally reviewed if the patient decides to come to clinic to my medical team (FYI RB).  Thanks. GM

## 2022-01-03 NOTE — Telephone Encounter (Signed)
Good Morning Dr. Rush Landmark,  Patient contacted our office today wanting to transfer care from [facility] to you. Last colon and endo was on 09/15/21. Patient stated that he is not pleased with his care there and would like to establish care with you. Will be sending records for review.  Please advise on scheduling.  Thank you.

## 2022-01-04 ENCOUNTER — Encounter: Payer: Self-pay | Admitting: Gastroenterology

## 2022-01-04 NOTE — Telephone Encounter (Signed)
Called patient. Pt would like to come in for visit. [place]. Pt has been scheduled for 02/21/22 at 10:30am for OV with Dr Jerilynn Mages.

## 2022-01-04 NOTE — Telephone Encounter (Signed)
Thank you for the update. Please keep his clinic notes available and I will review them on that particular date when he comes in for the visit. GM

## 2022-01-11 ENCOUNTER — Telehealth: Payer: Self-pay | Admitting: Gastroenterology

## 2022-01-20 ENCOUNTER — Ambulatory Visit: Payer: Medicare Other | Admitting: Gastroenterology

## 2022-02-21 ENCOUNTER — Ambulatory Visit: Payer: Medicare Other | Admitting: Gastroenterology

## 2022-03-28 ENCOUNTER — Ambulatory Visit: Payer: Medicare Other | Admitting: Nurse Practitioner

## 2022-03-28 NOTE — Patient Instructions (Incomplete)
Diabetes Mellitus and Foot Care Foot care is an important part of your health, especially when you have diabetes. Diabetes may cause you to have problems because of poor blood flow (circulation) to your feet and legs, which can cause your skin to: Become thinner and drier. Break more easily. Heal more slowly. Peel and crack. You may also have nerve damage (neuropathy) in your legs and feet, causing decreased feeling in them. This means that you may not notice minor injuries to your feet that could lead to more serious problems. Noticing and addressing any potential problems early is the best way to prevent future foot problems. How to care for your feet Foot hygiene  Wash your feet daily with warm water and mild soap. Do not use hot water. Then, pat your feet and the areas between your toes until they are completely dry. Do not soak your feet as this can dry your skin. Trim your toenails straight across. Do not dig under them or around the cuticle. File the edges of your nails with an emery board or nail file. Apply a moisturizing lotion or petroleum jelly to the skin on your feet and to dry, brittle toenails. Use lotion that does not contain alcohol and is unscented. Do not apply lotion between your toes. Shoes and socks Wear clean socks or stockings every day. Make sure they are not too tight. Do not wear knee-high stockings since they may decrease blood flow to your legs. Wear shoes that fit properly and have enough cushioning. Always look in your shoes before you put them on to be sure there are no objects inside. To break in new shoes, wear them for just a few hours a day. This prevents injuries on your feet. Wounds, scrapes, corns, and calluses  Check your feet daily for blisters, cuts, bruises, sores, and redness. If you cannot see the bottom of your feet, use a mirror or ask someone for help. Do not cut corns or calluses or try to remove them with medicine. If you find a minor scrape,  cut, or break in the skin on your feet, keep it and the skin around it clean and dry. You may clean these areas with mild soap and water. Do not clean the area with peroxide, alcohol, or iodine. If you have a wound, scrape, corn, or callus on your foot, look at it several times a day to make sure it is healing and not infected. Check for: Redness, swelling, or pain. Fluid or blood. Warmth. Pus or a bad smell. General tips Do not cross your legs. This may decrease blood flow to your feet. Do not use heating pads or hot water bottles on your feet. They may burn your skin. If you have lost feeling in your feet or legs, you may not know this is happening until it is too late. Protect your feet from hot and cold by wearing shoes, such as at the beach or on hot pavement. Schedule a complete foot exam at least once a year (annually) or more often if you have foot problems. Report any cuts, sores, or bruises to your health care provider immediately. Where to find more information [employer] Association: www.diabetes.org [employer]: www.diabeteseducator.org Contact a health care provider if: You have a medical condition that increases your risk of infection and you have any cuts, sores, or bruises on your feet. You have an injury that is not healing. You have redness on your legs or feet. You   feel burning or tingling in your legs or feet. You have pain or cramps in your legs and feet. Your legs or feet are numb. Your feet always feel cold. You have pain around any toenails. Get help right away if: You have a wound, scrape, corn, or callus on your foot and: You have pain, swelling, or redness that gets worse. You have fluid or blood coming from the wound, scrape, corn, or callus. Your wound, scrape, corn, or callus feels warm to the touch. You have pus or a bad smell coming from the wound, scrape, corn, or callus. You have a fever. You have a red  line going up your leg. Summary Check your feet every day for blisters, cuts, bruises, sores, and redness. Apply a moisturizing lotion or petroleum jelly to the skin on your feet and to dry, brittle toenails. Wear shoes that fit properly and have enough cushioning. If you have foot problems, report any cuts, sores, or bruises to your health care provider immediately. Schedule a complete foot exam at least once a year (annually) or more often if you have foot problems. This information is not intended to replace advice given to you by your health care provider. Make sure you discuss any questions you have with your health care provider. Document Revised: 05/20/2020 Document Reviewed: 05/20/2020 [employer] Patient Education  2023 [employer]
